# Patient Record
Sex: Male | Born: 1975 | Race: Black or African American | Hispanic: No | Marital: Single | Smoking: Former smoker
Health system: Southern US, Community
[De-identification: ages and names within clinical notes are randomized; demographics above are authoritative.]

## PROBLEM LIST (undated history)

## (undated) DIAGNOSIS — I509 Heart failure, unspecified: Secondary | ICD-10-CM

## (undated) DIAGNOSIS — I10 Essential (primary) hypertension: Secondary | ICD-10-CM

## (undated) DIAGNOSIS — E119 Type 2 diabetes mellitus without complications: Secondary | ICD-10-CM

## (undated) HISTORY — DX: Type 2 diabetes mellitus without complications: E11.9

## (undated) HISTORY — DX: Heart failure, unspecified: I50.9

## (undated) HISTORY — DX: Essential (primary) hypertension: I10

## (undated) HISTORY — PX: NO PAST SURGERIES: SHX2092

---

## 2019-03-12 DIAGNOSIS — E66813 Obesity, class 3: Secondary | ICD-10-CM

## 2019-03-12 HISTORY — DX: Morbid (severe) obesity due to excess calories: E66.01

## 2019-03-12 HISTORY — DX: Obesity, class 3: E66.813

## 2019-03-13 DIAGNOSIS — E119 Type 2 diabetes mellitus without complications: Secondary | ICD-10-CM

## 2019-03-13 DIAGNOSIS — I10 Essential (primary) hypertension: Secondary | ICD-10-CM | POA: Insufficient documentation

## 2019-03-13 DIAGNOSIS — R5381 Other malaise: Secondary | ICD-10-CM | POA: Insufficient documentation

## 2019-03-13 DIAGNOSIS — R778 Other specified abnormalities of plasma proteins: Secondary | ICD-10-CM

## 2019-03-13 DIAGNOSIS — R7989 Other specified abnormal findings of blood chemistry: Secondary | ICD-10-CM | POA: Insufficient documentation

## 2019-03-13 HISTORY — DX: Other specified abnormalities of plasma proteins: R77.8

## 2019-03-13 HISTORY — DX: Type 2 diabetes mellitus without complications: E11.9

## 2019-03-13 HISTORY — DX: Other specified abnormal findings of blood chemistry: R79.89

## 2019-03-13 HISTORY — DX: Other malaise: R53.81

## 2019-04-02 DIAGNOSIS — I5032 Chronic diastolic (congestive) heart failure: Secondary | ICD-10-CM

## 2019-04-02 HISTORY — DX: Chronic diastolic (congestive) heart failure: I50.32

## 2019-04-23 DIAGNOSIS — R0683 Snoring: Secondary | ICD-10-CM | POA: Insufficient documentation

## 2019-04-23 DIAGNOSIS — J9621 Acute and chronic respiratory failure with hypoxia: Secondary | ICD-10-CM

## 2019-04-23 DIAGNOSIS — R0681 Apnea, not elsewhere classified: Secondary | ICD-10-CM

## 2019-04-23 HISTORY — DX: Apnea, not elsewhere classified: R06.81

## 2019-04-23 HISTORY — DX: Snoring: R06.83

## 2019-04-23 HISTORY — DX: Acute and chronic respiratory failure with hypoxia: J96.21

## 2019-07-11 ENCOUNTER — Ambulatory Visit (INDEPENDENT_AMBULATORY_CARE_PROVIDER_SITE_OTHER): Payer: 59 | Admitting: Medical

## 2019-07-11 ENCOUNTER — Encounter: Payer: Self-pay | Admitting: Medical

## 2019-07-11 ENCOUNTER — Other Ambulatory Visit: Payer: Self-pay

## 2019-07-11 VITALS — BP 160/110 | HR 103 | Temp 97.2°F | Resp 18 | Ht 67.0 in | Wt 324.4 lb

## 2019-07-11 DIAGNOSIS — I509 Heart failure, unspecified: Secondary | ICD-10-CM

## 2019-07-11 DIAGNOSIS — I10 Essential (primary) hypertension: Secondary | ICD-10-CM | POA: Diagnosis not present

## 2019-07-11 DIAGNOSIS — E119 Type 2 diabetes mellitus without complications: Secondary | ICD-10-CM | POA: Diagnosis not present

## 2019-07-11 MED ORDER — HYDRALAZINE HCL 25 MG PO TABS: 25.0000 mg | ORAL_TABLET | Freq: Three times a day (TID) | ORAL | 0 refills | Status: DC

## 2019-07-11 MED ORDER — METOPROLOL TARTRATE 25 MG PO TABS: ORAL_TABLET | ORAL | 0 refills | Status: DC

## 2019-07-11 MED ORDER — AMILORIDE HCL 5 MG PO TABS: 5.0000 mg | ORAL_TABLET | Freq: Every day | ORAL | 0 refills | Status: DC

## 2019-07-11 MED ORDER — TORSEMIDE 20 MG PO TABS: 20.0000 mg | ORAL_TABLET | Freq: Every day | ORAL | 0 refills | Status: DC

## 2019-07-11 NOTE — Patient Instructions (Signed)
For htn, chf and diabetes, I filled all your former meds. You have metformin so continue to use.  Need to get labs and cxr to evaluated current conditions.  Hopefully bp will start to come down.   If you have cardiac or neurologic signs or symptoms then be seen in ED.  Follow up in 10 days or as needed. New to our practice so follow up extremely important.

## 2019-07-11 NOTE — Progress Notes (Signed)
Subjective:    Patient ID: Karl Jones, male    DOB: 10/16/1975, 44 y.o.   MRN: [id number]  HPI Pt in for first time here.  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 Pt is going to [employer] 5 days a week. Non smoker. Rare alcohol. Single.  Pt has hx of htn, diabetes and by chart review chf.  Pt bp is elevated today. Pt was on torsemide, lopressor and  Hydralazine.  Also chf diagnosis. Chart indicates he was also on midamor.  Pt has diabetes and is on metformin.   Pt also has sleep apnea. He uses cpap. Pt states he feels like he can't breath when uses mask.      Review of Systems  Constitutional: Negative for chills, fatigue and fever.  Respiratory: Negative for cough, chest tightness, shortness of breath and wheezing.   Cardiovascular: Negative for chest pain and palpitations.  Gastrointestinal: Negative for abdominal pain.  Genitourinary: Negative for dysuria, flank pain and frequency.  Musculoskeletal: Negative for back pain.  Neurological: Negative for dizziness, seizures, syncope, weakness, light-headedness and headaches.  Hematological: Negative for adenopathy. Does not bruise/bleed easily.  Psychiatric/Behavioral: Negative for behavioral problems and confusion.    Past Medical History:  Diagnosis Date  . CHF (congestive heart failure) (HCC)   . Diabetes mellitus without complication (HCC)   . Hypertension      Social History   Socioeconomic History  . Marital status: Single    Spouse name: Not on file  . Number of children: Not on file  . Years of education: Not on file  . Highest education level: Not on file  Occupational History  . Not on file  Tobacco Use  . Smoking status: Former Games developer  . Smokeless tobacco: Never Used  . Tobacco comment: last 3-4 years smoked about 3 cigrettes.  Substance and Sexual Activity  . Alcohol use: Yes    Comment: very rare.  . Drug use: Yes    Types: Marijuana     Comment: occasional/rare.  . Sexual activity: Not on file  Other Topics Concern  . Not on file  Social History Narrative  . Not on file   Social Determinants of Health   Financial Resource Strain:   . Difficulty of Paying Living Expenses:   Food Insecurity:   . Worried About Programme researcher, broadcasting/film/video in the Last Year:   . Barista in the Last Year:   Transportation Needs:   . Freight forwarder (Medical):   Marland Kitchen Lack of Transportation (Non-Medical):   Physical Activity:   . Days of Exercise per Week:   . Minutes of Exercise per Session:   Stress:   . Feeling of Stress :   Social Connections:   . Frequency of Communication with Friends and Family:   . Frequency of Social Gatherings with Friends and Family:   . Attends Religious Services:   . Active Member of Clubs or Organizations:   . Attends Banker Meetings:   Marland Kitchen Marital Status:   Intimate Partner Violence:   . Fear of Current or Ex-Partner:   . Emotionally Abused:   Marland Kitchen Physically Abused:   . Sexually Abused:     History reviewed. No pertinent surgical history.  History reviewed. No pertinent family history.  No Known Allergies  Current Outpatient Medications on File Prior to Visit  Medication Sig Dispense Refill  . metFORMIN (GLUCOPHAGE-XR) 500 MG 24 hr tablet Take by mouth.  No current facility-administered medications on file prior to visit.    BP (!) 160/110 (BP Location: Left Arm, Patient Position: Sitting, Cuff Size: Large)   Pulse (!) 103   Temp (!) 97.2 F (36.2 C) (Temporal)   Resp 18   Ht 5\' 7"  (1.702 m)   Wt (!) 324 lb 6.4 oz (147.1 kg)   SpO2 94%   BMI 50.81 kg/m       Objective:   Physical Exam   General Mental Status- Alert. General Appearance- Not in acute distress.   Skin General: Color- Normal Color. Moisture- Normal Moisture.  Neck Carotid Arteries- Normal color. Moisture- Normal Moisture. No carotid bruits. No JVD.  Chest and Lung  Exam Auscultation: Breath Sounds:-Normal.  Cardiovascular Auscultation:Rythm- Regular. Murmurs & Other Heart Sounds:Auscultation of the heart reveals- No Murmurs.  Abdomen Inspection:-Inspeection Normal. Palpation/Percussion:Note:No mass. Palpation and Percussion of the abdomen reveal- Non Tender, Non Distended + BS, no rebound or guarding.    Neurologic Cranial Nerve exam:- CN III-XII intact(No nystagmus), symmetric smile. Strength:- 5/5 equal and symmetric strength both upper and lower extremities.   Lower ext- no pedal edema. Negative homans bilaterally.    Assessment & Plan:  For htn, chf and diabetes, I filled all your former meds. You have metformin so continue to use.  Need to get labs and cxr to evaluated current conditions.  Hopefully bp will start to come down.   If you have cardiac or neurologic signs or symptoms then be seen in ED.  Follow up in 2 weeks or as needed. New to our practice so follow up extremely important.  , PA-C

## 2019-07-12 ENCOUNTER — Other Ambulatory Visit: Payer: Self-pay | Admitting: Medical

## 2019-07-12 ENCOUNTER — Telehealth: Payer: Self-pay | Admitting: Medical

## 2019-07-12 LAB — COMPREHENSIVE METABOLIC PANEL
ALT: 21 U/L (ref 0–53)
AST: 22 U/L (ref 0–37)
Albumin: 4.4 g/dL (ref 3.5–5.2)
Alkaline Phosphatase: 83 U/L (ref 39–117)
BUN: 11 mg/dL (ref 6–23)
CO2: 36 mEq/L — ABNORMAL HIGH (ref 19–32)
Calcium: 9.9 mg/dL (ref 8.4–10.5)
Chloride: 98 mEq/L (ref 96–112)
Creatinine, Ser: 0.99 mg/dL (ref 0.40–1.50)
GFR: 99.97 mL/min (ref >60.00)
Glucose, Bld: 98 mg/dL (ref 70–99)
Potassium: 3.2 mEq/L — ABNORMAL LOW (ref 3.5–5.1)
Sodium: 141 mEq/L (ref 135–145)
Total Bilirubin: 0.3 mg/dL (ref 0.2–1.2)
Total Protein: 7.9 g/dL (ref 6.0–8.3)

## 2019-07-12 LAB — HEMOGLOBIN A1C: Hgb A1c MFr Bld: 5.2 % (ref 4.6–6.5)

## 2019-07-12 LAB — BRAIN NATRIURETIC PEPTIDE: Pro B Natriuretic peptide (BNP): 223 pg/mL — ABNORMAL HIGH (ref 0.0–100.0)

## 2019-07-12 MED ORDER — POTASSIUM CHLORIDE ER 10 MEQ PO TBCR: 10.0000 meq | EXTENDED_RELEASE_TABLET | Freq: Every day | ORAL | 0 refills | Status: DC

## 2019-07-12 NOTE — Telephone Encounter (Signed)
Low dose potassium sent in for low potassium

## 2019-07-13 ENCOUNTER — Telehealth: Payer: Self-pay | Admitting: Medical

## 2019-07-13 NOTE — Telephone Encounter (Signed)
Pt k is low. He is on torsemide and mdiamor. New pt to our practice. Brief rx k as he restarts all his meds.

## 2019-07-23 ENCOUNTER — Ambulatory Visit: Payer: 59 | Admitting: Medical

## 2019-07-23 ENCOUNTER — Other Ambulatory Visit: Payer: Self-pay

## 2019-07-23 ENCOUNTER — Encounter: Payer: Self-pay | Admitting: Medical

## 2019-07-23 VITALS — BP 130/70 | HR 93 | Resp 18 | Ht 67.0 in | Wt 329.4 lb

## 2019-07-23 DIAGNOSIS — I10 Essential (primary) hypertension: Secondary | ICD-10-CM | POA: Diagnosis not present

## 2019-07-23 DIAGNOSIS — I509 Heart failure, unspecified: Secondary | ICD-10-CM | POA: Diagnosis not present

## 2019-07-23 DIAGNOSIS — E119 Type 2 diabetes mellitus without complications: Secondary | ICD-10-CM

## 2019-07-23 DIAGNOSIS — E876 Hypokalemia: Secondary | ICD-10-CM | POA: Diagnosis not present

## 2019-07-23 NOTE — Progress Notes (Signed)
Subjective:    Patient ID: Karl Jones, male    DOB: 1975-07-06, 44 y.o.   MRN: [id number]  HPI  Pt in today for follow up.   Hx of htn, chf and diabetes. Pt had been off all of his meds for 2 weeks before I saw him.  I refilled all of his meds and he is in for follow up on low potassium and to recheck his bp.  Pt started back on amiloride, hydralazine, metoprolol and torsemide.   Pt tells me in past he was taking hydralazine 25 mg 2 tab po tid.        Review of Systems  Constitutional: Negative for chills and fatigue.  Respiratory: Negative for cough, chest tightness, shortness of breath and wheezing.   Cardiovascular: Negative for chest pain and palpitations.  Musculoskeletal: Negative for back pain, myalgias and neck stiffness.  Skin: Negative for rash.  Neurological: Negative for dizziness, seizures, weakness, numbness and headaches.  Hematological: Negative for adenopathy. Does not bruise/bleed easily.  Psychiatric/Behavioral: Negative for confusion, dysphoric mood, sleep disturbance and suicidal ideas. The patient is not nervous/anxious.     Past Medical History:  Diagnosis Date  . CHF (congestive heart failure) (HCC)   . Diabetes mellitus without complication (HCC)   . Hypertension      Social History   Socioeconomic History  . Marital status: Single    Spouse name: Not on file  . Number of children: Not on file  . Years of education: Not on file  . Highest education level: Not on file  Occupational History  . Not on file  Tobacco Use  . Smoking status: Former Games developer  . Smokeless tobacco: Never Used  . Tobacco comment: last 3-4 years smoked about 3 cigrettes.  Substance and Sexual Activity  . Alcohol use: Yes    Comment: very rare.  . Drug use: Yes    Types: Marijuana    Comment: occasional/rare.  . Sexual activity: Not on file  Other Topics Concern  . Not on file  Social History Narrative  . Not on file   Social Determinants of Health     Financial Resource Strain:   . Difficulty of Paying Living Expenses:   Food Insecurity:   . Worried About Programme researcher, broadcasting/film/video in the Last Year:   . Barista in the Last Year:   Transportation Needs:   . Freight forwarder (Medical):   Marland Kitchen Lack of Transportation (Non-Medical):   Physical Activity:   . Days of Exercise per Week:   . Minutes of Exercise per Session:   Stress:   . Feeling of Stress :   Social Connections:   . Frequency of Communication with Friends and Family:   . Frequency of Social Gatherings with Friends and Family:   . Attends Religious Services:   . Active Member of Clubs or Organizations:   . Attends Banker Meetings:   Marland Kitchen Marital Status:   Intimate Partner Violence:   . Fear of Current or Ex-Partner:   . Emotionally Abused:   Marland Kitchen Physically Abused:   . Sexually Abused:     No past surgical history on file.  No family history on file.  No Known Allergies  Current Outpatient Medications on File Prior to Visit  Medication Sig Dispense Refill  . aMILoride (MIDAMOR) 5 MG tablet Take 1 tablet (5 mg total) by mouth daily. 30 tablet 0  . hydrALAZINE (APRESOLINE) 25 MG tablet Take 1 tablet (  25 mg total) by mouth 3 (three) times daily. 90 tablet 0  . metFORMIN (GLUCOPHAGE-XR) 500 MG 24 hr tablet Take by mouth.    . metoprolol tartrate (LOPRESSOR) 25 MG tablet 1/2 tab po q day 45 tablet 0  . potassium chloride (KLOR-CON) 10 MEQ tablet TAKE 1 TABLET BY MOUTH ONCE DAILY 7 tablet 0  . torsemide (DEMADEX) 20 MG tablet Take 1 tablet (20 mg total) by mouth daily. 30 tablet 0   No current facility-administered medications on file prior to visit.    BP 130/70   Pulse 93   Resp 18   Ht 5\' 7"  (1.702 m)   Wt (!) 329 lb 6.4 oz (149.4 kg)   SpO2 94%   BMI 51.59 kg/m       Objective:   Physical Exam  General Mental Status- Alert. General Appearance- Not in acute distress.   Skin General: Color- Normal Color. Moisture- Normal  Moisture.  Neck Carotid Arteries- Normal color. Moisture- Normal Moisture. No carotid bruits. No JVD.  Chest and Lung Exam Auscultation: Breath Sounds:-Normal.  Cardiovascular Auscultation:Rythm- Regular. Murmurs & Other Heart Sounds:Auscultation of the heart reveals- No Murmurs.  Abdomen Inspection:-Inspeection Normal. Palpation/Percussion:Note:No mass. Palpation and Percussion of the abdomen reveal- Non Tender, Non Distended + BS, no rebound or guarding.   Neurologic Cranial Nerve exam:- CN III-XII intact(No nystagmus), symmetric smile. Strength:- 5/5 equal and symmetric strength both upper and lower extremities.      Assessment & Plan:  Your bp was about 130/70 3 consecutive manual  checks today after very high machine readings. Check bp at home as we want to see your daily trend. If an bp readings 150 systolic or over 90 diastolic let know. Migh adjust bp meds if you get elevated levels.  For chf hx will refer you to cardiologist.   For low k on labs repeat cmp today. Include lipid panel today as well to assess risk factors for cardiovascular diseases.  Your sugar has been tightly controlled.  Follow up in 1 month or as needed  Gave rx for electronic bp cuff.  Time spent with patient today was 30  minutes which consisted of chart review, discussing diagnosis, work up, lab review,  Treatment,placing referral and documentation.

## 2019-07-23 NOTE — Patient Instructions (Addendum)
Your bp was about 130/70 3 consecutive manual checks today after very high machine readings. Check bp at home as we want to see your daily trend. If an bp readings 150 systolic or over 90 diastolic let us know. Migh adjust bp meds if you get elevated levels.  For chf hx will refer you to cardiologist.   For low k on labs repeat cmp today. Include lipid panel today as well to assess risk factors for cardiovascular diseases.  Your sugar has been tightly controlled.  Follow up in 1 month or as needed

## 2019-07-24 ENCOUNTER — Telehealth: Payer: Self-pay | Admitting: Medical

## 2019-07-24 LAB — LIPID PANEL
Cholesterol: 218 mg/dL — ABNORMAL HIGH (ref 0–200)
HDL: 41.8 mg/dL (ref >39.00)
NonHDL: 175.81
Total CHOL/HDL Ratio: 5
Triglycerides: 218 mg/dL — ABNORMAL HIGH (ref 0.0–149.0)
VLDL: 43.6 mg/dL — ABNORMAL HIGH (ref 0.0–40.0)

## 2019-07-24 LAB — COMPREHENSIVE METABOLIC PANEL
ALT: 25 U/L (ref 0–53)
AST: 24 U/L (ref 0–37)
Albumin: 4.3 g/dL (ref 3.5–5.2)
Alkaline Phosphatase: 79 U/L (ref 39–117)
BUN: 13 mg/dL (ref 6–23)
CO2: 36 mEq/L — ABNORMAL HIGH (ref 19–32)
Calcium: 9.2 mg/dL (ref 8.4–10.5)
Chloride: 98 mEq/L (ref 96–112)
Creatinine, Ser: 0.99 mg/dL (ref 0.40–1.50)
GFR: 99.96 mL/min (ref >60.00)
Glucose, Bld: 92 mg/dL (ref 70–99)
Potassium: 3.8 mEq/L (ref 3.5–5.1)
Sodium: 141 mEq/L (ref 135–145)
Total Bilirubin: 0.3 mg/dL (ref 0.2–1.2)
Total Protein: 7.8 g/dL (ref 6.0–8.3)

## 2019-07-24 LAB — LDL CHOLESTEROL, DIRECT: Direct LDL: 135 mg/dL

## 2019-07-24 MED ORDER — ATORVASTATIN CALCIUM 10 MG PO TABS: 10.0000 mg | ORAL_TABLET | Freq: Every day | ORAL | 11 refills | Status: DC

## 2019-07-24 NOTE — Telephone Encounter (Signed)
Rx atorvastatin sent to pt pharmacy. 

## 2019-07-25 ENCOUNTER — Telehealth: Payer: Self-pay | Admitting: Medical

## 2019-07-25 NOTE — Telephone Encounter (Signed)
Patient called in regards to lab results. Patient would like a call back.

## 2019-07-25 NOTE — Telephone Encounter (Signed)
Labs were reviewed.

## 2019-08-17 DIAGNOSIS — I509 Heart failure, unspecified: Secondary | ICD-10-CM | POA: Insufficient documentation

## 2019-08-20 ENCOUNTER — Ambulatory Visit (INDEPENDENT_AMBULATORY_CARE_PROVIDER_SITE_OTHER): Payer: 59 | Admitting: Cardiology

## 2019-08-20 ENCOUNTER — Encounter: Payer: Self-pay | Admitting: Cardiology

## 2019-08-20 ENCOUNTER — Other Ambulatory Visit: Payer: Self-pay

## 2019-08-20 VITALS — BP 128/94 | HR 98 | Ht 67.0 in | Wt 304.0 lb

## 2019-08-20 DIAGNOSIS — E782 Mixed hyperlipidemia: Secondary | ICD-10-CM

## 2019-08-20 DIAGNOSIS — I10 Essential (primary) hypertension: Secondary | ICD-10-CM

## 2019-08-20 DIAGNOSIS — R0600 Dyspnea, unspecified: Secondary | ICD-10-CM | POA: Diagnosis not present

## 2019-08-20 DIAGNOSIS — R0609 Other forms of dyspnea: Secondary | ICD-10-CM

## 2019-08-20 DIAGNOSIS — G4730 Sleep apnea, unspecified: Secondary | ICD-10-CM

## 2019-08-20 HISTORY — DX: Dyspnea, unspecified: R06.00

## 2019-08-20 HISTORY — DX: Morbid (severe) obesity due to excess calories: E66.01

## 2019-08-20 HISTORY — DX: Other forms of dyspnea: R06.09

## 2019-08-20 HISTORY — DX: Mixed hyperlipidemia: E78.2

## 2019-08-20 HISTORY — DX: Essential (primary) hypertension: I10

## 2019-08-20 HISTORY — DX: Sleep apnea, unspecified: G47.30

## 2019-08-20 NOTE — Patient Instructions (Signed)
Medication Instructions:  No medication changes. *If you need a refill on your cardiac medications before your next appointment, please call your pharmacy*   Lab Work: Your physician recommends that you have a BMET and Magnesium level done in the office today.  If you have labs (blood work) drawn today and your tests are completely normal, you will receive your results only by: Marland Kitchen MyChart Message (if you have MyChart) OR . A paper copy in the mail If you have any lab test that is abnormal or we need to change your treatment, we will call you to review the results.   Testing/Procedures: Your physician has requested that you have an echocardiogram. Echocardiography is a painless test that uses sound waves to create images of your heart. It provides your doctor with information about the size and shape of your heart and how well your heart's chambers and valves are working. This procedure takes approximately one hour. There are no restrictions for this procedure.  Your physician has requested that you have a lexiscan myoview. For further information please visit [email or web address] Please follow instruction sheet, as given.  The test will over 2 days and take approximately 3 to 4 hours each day to complete; you may bring reading material.  If someone comes with you to your appointment, they will need to remain in the main lobby due to limited space in the testing area.   How to prepare for your Myocardial Perfusion Test: . Do not eat or drink 3 hours prior to your test, except you may have water. . Do not consume products containing caffeine (regular or decaffeinated) 12 hours prior to your test. (ex: coffee, chocolate, sodas, tea). . Do bring a list of your current medications with you.  If not listed below, you may take your medications as normal. . Do wear comfortable clothes (no dresses or overalls) and walking shoes, tennis shoes preferred (No heels or open toe shoes are allowed). . Do  NOT wear cologne, perfume, aftershave, or lotions (deodorant is allowed). . If these instructions are not followed, your test will have to be rescheduled.   Follow-Up: At [facility], you and your health needs are our priority.  As part of our continuing mission to provide you with exceptional heart care, we have created designated Provider Care Teams.  These Care T Cardiac Nuclear Scan A cardiac nuclear scan is a test that is done to check the flow of blood to your heart. It is done when you are resting and when you are exercising. The test looks for problems such as:  Not enough blood reaching a portion of the heart.  The heart muscle not working as it should. You may need this test if:  You have heart disease.  You have had lab results that are not normal.  You have had heart surgery or a balloon procedure to open up blocked arteries (angioplasty).  You have chest pain.  You have shortness of breath. In this test, a special dye (tracer) is put into your bloodstream. The tracer will travel to your heart. A camera will then take pictures of your heart to see how the tracer moves through your heart. This test is usually done at a hospital and takes 2-4 hours. Tell a doctor about:  Any allergies you have.  All medicines you are taking, including vitamins, herbs, eye drops, creams, and over-the-counter medicines.  Any problems you or family members have had with anesthetic medicines.  Any blood disorders you have.  Any surgeries you have had.  Any medical conditions you have.  Whether you are pregnant or may be pregnant. What are the risks? Generally, this is a safe test. However, problems may occur, such as:  Serious chest pain and heart attack. This is only a risk if the stress portion of the test is done.  Rapid heartbeat.  A feeling of warmth in your chest. This feeling usually does not last long.  Allergic reaction to the tracer. What happens before the  test?  Ask your doctor about changing or stopping your normal medicines. This is important.  Follow instructions from your doctor about what you cannot eat or drink.  Remove your jewelry on the day of the test. What happens during the test?  An IV tube will be inserted into one of your veins.  Your doctor will give you a small amount of tracer through the IV tube.  You will wait for 20-40 minutes while the tracer moves through your bloodstream.  Your heart will be monitored with an electrocardiogram (ECG).  You will lie down on an exam table.  Pictures of your heart will be taken for about 15-20 minutes.  You may also have a stress test. For this test, one of these things may be done: ? You will be asked to exercise on a treadmill or a stationary bike. ? You will be given medicines that will make your heart work harder. This is done if you are unable to exercise.  When blood flow to your heart has peaked, a tracer will again be given through the IV tube.  After 20-40 minutes, you will get back on the exam table. More pictures will be taken of your heart.  Depending on the tracer that is used, more pictures may need to be taken 3-4 hours later.  Your IV tube will be removed when the test is over. The test may vary among doctors and hospitals. What happens after the test?  Ask your doctor: ? Whether you can return to your normal schedule, including diet, activities, and medicines. ? Whether you should drink more fluids. This will help to remove the tracer from your body. Drink enough fluid to keep your pee (urine) pale yellow.  Ask your doctor, or the department that is doing the test: ? When will my results be ready? ? How will I get my results? Summary  A cardiac nuclear scan is a test that is done to check the flow of blood to your heart.  Tell your doctor whether you are pregnant or may be pregnant.  Before the test, ask your doctor about changing or stopping your  normal medicines. This is important.  Ask your doctor whether you can return to your normal activities. You may be asked to drink more fluids. This information is not intended to replace advice given to you by your health care provider. Make sure you discuss any questions you have with your health care provider. Document Revised: 04/19/2018 Document Reviewed: 06/13/2017 Elsevier Patient Education  2020 [employer]. eams include your primary Cardiologist (physician) and Advanced Practice Providers (APPs -  Physician Assistants and Nurse Practitioners) who all work together to provide you with the care you need, when you need it.  We recommend signing up for the patient portal called "MyChart".  Sign up information is provided on this After Visit Summary.  MyChart is used to connect with patients for Virtual Visits (Telemedicine).  Patients are able to view lab/test results, encounter notes, upcoming  appointments, etc.  Non-urgent messages can be sent to your provider as well.   To learn more about what you can do with MyChart, go to [email or web address].    Your next appointment:   3 month(s)  The format for your next appointment:   In Person  Provider:   Belva Crome, MD   Other Instructions  Echocardiogram An echocardiogram is a procedure that uses painless sound waves (ultrasound) to produce an image of the heart. Images from an echocardiogram can provide important information about:  Signs of coronary artery disease (CAD).  Aneurysm detection. An aneurysm is a weak or damaged part of an artery wall that bulges out from the normal force of blood pumping through the body.  Heart size and shape. Changes in the size or shape of the heart can be associated with certain conditions, including heart failure, aneurysm, and CAD.  Heart muscle function.  Heart valve function.  Signs of a past heart attack.  Fluid buildup around the heart.  Thickening of the heart muscle.  A  tumor or infectious growth around the heart valves. Tell a health care provider about:  Any allergies you have.  All medicines you are taking, including vitamins, herbs, eye drops, creams, and over-the-counter medicines.  Any blood disorders you have.  Any surgeries you have had.  Any medical conditions you have.  Whether you are pregnant or may be pregnant. What are the risks? Generally, this is a safe procedure. However, problems may occur, including:  Allergic reaction to dye (contrast) that may be used during the procedure. What happens before the procedure? No specific preparation is needed. You may eat and drink normally. What happens during the procedure?   An IV tube may be inserted into one of your veins.  You may receive contrast through this tube. A contrast is an injection that improves the quality of the pictures from your heart.  A gel will be applied to your chest.  A wand-like tool (transducer) will be moved over your chest. The gel will help to transmit the sound waves from the transducer.  The sound waves will harmlessly bounce off of your heart to allow the heart images to be captured in real-time motion. The images will be recorded on a computer. The procedure may vary among health care providers and hospitals. What happens after the procedure?  You may return to your normal, everyday life, including diet, activities, and medicines, unless your health care provider tells you not to do that. Summary  An echocardiogram is a procedure that uses painless sound waves (ultrasound) to produce an image of the heart.  Images from an echocardiogram can provide important information about the size and shape of your heart, heart muscle function, heart valve function, and fluid buildup around your heart.  You do not need to do anything to prepare before this procedure. You may eat and drink normally.  After the echocardiogram is completed, you may return to your  normal, everyday life, unless your health care provider tells you not to do that. This information is not intended to replace advice given to you by your health care provider. Make sure you discuss any questions you have with your health care provider. Document Revised: 04/20/2018 Document Reviewed: 01/31/2016 Elsevier Patient Education  2020 [employer].

## 2019-08-20 NOTE — Progress Notes (Signed)
Cardiology Office Note:    Date:  08/20/2019   ID:  Karl Jones, DOB [birth year], MRN [id number]  PCP:  Esperanza Richters, PA-C  Cardiologist:  Garwin Brothers, MD   Referring MD: Esperanza Richters, PA-C    ASSESSMENT:    1. Hypertension, unspecified type   2. DOE (dyspnea on exertion)   3. Essential hypertension   4. Sleep apnea, unspecified type   5. Morbid obesity (HCC)   6. Mixed dyslipidemia    PLAN:    In order of problems listed above:  1. Primary prevention stressed with the patient.  Importance of compliance with diet medication stressed any vocalized understanding. 2. Essential hypertension: Blood pressure stable.  He will keep a track of it at home.  He is under stressful situation because of his father's death.  I told him to keep a track of his blood pressures. 3. Dyspnea on exertion: Knee pain anginal equivalents we will do a Lexiscan sestamibi to assess this.  He has multiple risk factors for coronary artery disease. 4. Diabetes mellitus and mixed dyslipidemia: Diet was emphasized.  He promises to do better. 5. Morbid obesity: Risks of obesity explained and he is going to do his best to lose weight.  He is already on the track to lose weight.  I explained to him the risks and he is very focused and is going to do this regularly. 6. Cardiac murmur: Echocardiogram will be done to assess murmur heard on auscultation. 7. Patient will be seen in follow-up appointment in 6 months or earlier if the patient has any concerns 8. He knows to go to nearest emergency room for any concerning symptoms.  Also for flu to prolongation I will do a Chem-7 and a magnesium level today.   Medication Adjustments/Labs and Tests Ordered: Current medicines are reviewed at length with the patient today.  Concerns regarding medicines are outlined above.  Orders Placed This Encounter  Procedures  . Basic metabolic panel  . Magnesium  . MYOCARDIAL PERFUSION IMAGING  . EKG 12-Lead  .  ECHOCARDIOGRAM COMPLETE   No orders of the defined types were placed in this encounter.    History of Present Illness:    Karl Jones is a 44 y.o. male who is being seen today for the evaluation of dyspnea on exertion and essential hypertension at the request of [place], Ramon Dred[place], [place].  Patient is a pleasant 44 year old male.  He has past medical history of essential hypertension dyslipidemia and diabetes mellitus.  He is morbidly obese.  He leads a sedentary lifestyle.  He has sleep apnea.  Unfortunately he lost his dad this morning and is sad for the same reason.  He denies any chest pain orthopnea or PND.  He mentions to me that his sleep apnea machine is effective and he is trying to get established with a sleep specialist who accepts his insurance.  At the time of my evaluation, the patient is alert awake oriented and in no distress.  Past Medical History:  Diagnosis Date  . Acute on chronic respiratory failure with hypoxia and hypercapnia (HCC) 04/23/2019  . CHF (congestive heart failure) (HCC)   . Chronic diastolic heart failure (HCC) 04/02/2019  . Class 3 severe obesity in adult [facility]) 03/12/2019  . Diabetes mellitus (HCC) 03/13/2019   Last Assessment & Plan:  Formatting of this note is different from the original. Lab Results  Component Value Date   HBA1C 6.6 (H) 03/13/2019  - Continue SSI - Hypoglycemia protocol -  TID AC fingersticks - Consistent carb diet  - Nutrition services consulted for diet instructions - Patient will require diabetic medication upon discharge  . Diabetes mellitus without complication (HCC)   . Elevated troponin I level 03/13/2019  . Hypertension   . Physical deconditioning 03/13/2019   Last Assessment & Plan:  Formatting of this note might be different from the original. Patient had a recent fall at home - PT/OT consulted.  May likely require facility placement.  . Snoring 04/23/2019  . Witnessed apneic spells 04/23/2019    Past Surgical History:  Procedure  Laterality Date  . NO PAST SURGERIES      Current Medications: Current Meds  Medication Sig  . aMILoride (MIDAMOR) 5 MG tablet Take 1 tablet (5 mg total) by mouth daily.  Marland Kitchen atorvastatin (LIPITOR) 10 MG tablet Take 1 tablet (10 mg total) by mouth daily.  . hydrALAZINE (APRESOLINE) 25 MG tablet Take 1 tablet (25 mg total) by mouth 3 (three) times daily.  . metFORMIN (GLUCOPHAGE-XR) 500 MG 24 hr tablet Take by mouth.  . metoprolol tartrate (LOPRESSOR) 25 MG tablet 1/2 tab po q day (Patient taking differently: Take 25 mg by mouth daily. 1/2 tab po q day)  . potassium chloride (KLOR-CON) 10 MEQ tablet TAKE 1 TABLET BY MOUTH ONCE DAILY  . torsemide (DEMADEX) 20 MG tablet Take 1 tablet (20 mg total) by mouth daily.     Allergies:   Patient has no known allergies.   Social History   Socioeconomic History  . Marital status: Single    Spouse name: Not on file  . Number of children: Not on file  . Years of education: Not on file  . Highest education level: Not on file  Occupational History  . Not on file  Tobacco Use  . Smoking status: Former Games developer  . Smokeless tobacco: Never Used  . Tobacco comment: last 3-4 years smoked about 3 cigrettes.  Substance and Sexual Activity  . Alcohol use: Yes    Comment: very rare.  . Drug use: Yes    Types: Marijuana    Comment: occasional/rare.  . Sexual activity: Not on file  Other Topics Concern  . Not on file  Social History Narrative  . Not on file   Social Determinants of Health   Financial Resource Strain:   . Difficulty of Paying Living Expenses:   Food Insecurity:   . Worried About Programme researcher, broadcasting/film/video in the Last Year:   . Barista in the Last Year:   Transportation Needs:   . Freight forwarder (Medical):   Marland Kitchen Lack of Transportation (Non-Medical):   Physical Activity:   . Days of Exercise per Week:   . Minutes of Exercise per Session:   Stress:   . Feeling of Stress :   Social Connections:   . Frequency of  Communication with Friends and Family:   . Frequency of Social Gatherings with Friends and Family:   . Attends Religious Services:   . Active Member of Clubs or Organizations:   . Attends Banker Meetings:   Marland Kitchen Marital Status:      Family History: The patient's family history includes Dementia in his maternal grandmother; Hypertension in his mother.  ROS:   Please see the history of present illness.    All other systems reviewed and are negative.  EKGs/Labs/Other Studies Reviewed:    The following studies were reviewed today: EKG with sinus rhythm and nonspecific ST-T changes and elevated  QT interval.   Recent Labs: 07/11/2019: Pro B Natriuretic peptide (BNP) 223.0 07/23/2019: ALT 25; BUN 13; Creatinine, Ser 0.99; Potassium 3.8; Sodium 141  Recent Lipid Panel    Component Value Date/Time   CHOL 218 (H) 07/23/2019 1544   TRIG 218.0 (H) 07/23/2019 1544   HDL 41.80 07/23/2019 1544   CHOLHDL 5 07/23/2019 1544   VLDL 43.6 (H) 07/23/2019 1544   LDLDIRECT 135.0 07/23/2019 1544    Physical Exam:    VS:  BP (!) 128/94 (BP Location: Left Arm)   Pulse 98   Ht 5\' 7"  (1.702 m)   Wt (!) 304 lb (137.9 kg)   SpO2 94%   BMI 47.61 kg/m     Wt Readings from Last 3 Encounters:  08/20/19 (!) 304 lb (137.9 kg)  07/23/19 (!) 329 lb 6.4 oz (149.4 kg)  07/11/19 (!) 324 lb 6.4 oz (147.1 kg)     GEN: Patient is in no acute distress HEENT: Normal NECK: No JVD; No carotid bruits LYMPHATICS: No lymphadenopathy CARDIAC: S1 S2 regular, 2/6 systolic murmur at the apex. RESPIRATORY:  Clear to auscultation without rales, wheezing or rhonchi  ABDOMEN: Soft, non-tender, non-distended MUSCULOSKELETAL:  No edema; No deformity  SKIN: Warm and dry NEUROLOGIC:  Alert and oriented x 3 PSYCHIATRIC:  Normal affect    Signed, 07/13/19, MD  08/20/2019 4:37 PM    [facility]

## 2019-08-21 LAB — BASIC METABOLIC PANEL
BUN/Creatinine Ratio: 10 (ref 9–20)
BUN: 12 mg/dL (ref 6–24)
CO2: 30 mmol/L — ABNORMAL HIGH (ref 20–29)
Calcium: 9.5 mg/dL (ref 8.7–10.2)
Chloride: 97 mmol/L (ref 96–106)
Creatinine, Ser: 1.24 mg/dL (ref 0.76–1.27)
GFR calc Af Amer: 82 mL/min/{1.73_m2} (ref >59)
GFR calc non Af Amer: 71 mL/min/{1.73_m2} (ref >59)
Glucose: 104 mg/dL — ABNORMAL HIGH (ref 65–99)
Potassium: 4 mmol/L (ref 3.5–5.2)
Sodium: 141 mmol/L (ref 134–144)

## 2019-08-21 LAB — MAGNESIUM: Magnesium: 1.8 mg/dL (ref 1.6–2.3)

## 2019-09-06 ENCOUNTER — Telehealth (HOSPITAL_COMMUNITY): Payer: Self-pay | Admitting: *Deleted

## 2019-09-06 NOTE — Telephone Encounter (Signed)
Patient given detailed instructions per Myocardial Perfusion Study Information Sheet for the test on 09/10/19 at 8:15. Patient notified to arrive 15 minutes early and that it is imperative to arrive on time for appointment to keep from having the test rescheduled.  If you need to cancel or reschedule your appointment, please call the office within 24 hours of your appointment. . Patient verbalized understanding.Karl Jones

## 2019-09-10 ENCOUNTER — Telehealth: Payer: Self-pay | Admitting: Medical

## 2019-09-10 ENCOUNTER — Other Ambulatory Visit: Payer: Self-pay

## 2019-09-10 ENCOUNTER — Ambulatory Visit (HOSPITAL_COMMUNITY): Payer: 59 | Attending: Cardiology

## 2019-09-10 ENCOUNTER — Encounter (HOSPITAL_COMMUNITY): Payer: Self-pay

## 2019-09-10 DIAGNOSIS — I10 Essential (primary) hypertension: Secondary | ICD-10-CM | POA: Diagnosis present

## 2019-09-10 DIAGNOSIS — R0609 Other forms of dyspnea: Secondary | ICD-10-CM

## 2019-09-10 DIAGNOSIS — R0600 Dyspnea, unspecified: Secondary | ICD-10-CM | POA: Diagnosis present

## 2019-09-10 MED ORDER — REGADENOSON 0.4 MG/5ML IV SOLN
0.4000 mg | Freq: Once | INTRAVENOUS | Status: AC
  Administered 2019-09-10: 0.4 mg via INTRAVENOUS

## 2019-09-10 MED ORDER — AMILORIDE HCL 5 MG PO TABS: 5.0000 mg | ORAL_TABLET | Freq: Every day | ORAL | 0 refills | Status: DC

## 2019-09-10 MED ORDER — TORSEMIDE 20 MG PO TABS: 20.0000 mg | ORAL_TABLET | Freq: Every day | ORAL | 0 refills | Status: DC

## 2019-09-10 MED ORDER — METOPROLOL TARTRATE 25 MG PO TABS: ORAL_TABLET | ORAL | 0 refills | Status: DC

## 2019-09-10 MED ORDER — TECHNETIUM TC 99M TETROFOSMIN IV KIT
32.7000 | PACK | Freq: Once | INTRAVENOUS | Status: AC | PRN
  Administered 2019-09-10: 32.7 via INTRAVENOUS
  Filled 2019-09-10: qty 33

## 2019-09-10 NOTE — Telephone Encounter (Signed)
Medication: torsemide (DEMADEX) 20 MG tablet [[id number]   aMILoride (MIDAMOR) 5 MG tablet [[id number]  metoprolol tartrate (LOPRESSOR) 25 MG tablet [[id number]]   Has the patient contacted their pharmacy? No. (If no, request that the patient contact the pharmacy for the refill.) (If yes, when and what did the pharmacy advise?)  Preferred Pharmacy (with phone number or street name): [id number] - [place], [place] - [address]  [address], [place] [address]  Phone:  [phone] Fax:  [phone]  DEA #:  --  Agent: Please be advised that RX refills may take up to 3 business days. We ask that you follow-up with your pharmacy.

## 2019-09-10 NOTE — Telephone Encounter (Signed)
Rx sent 

## 2019-09-11 ENCOUNTER — Ambulatory Visit (HOSPITAL_COMMUNITY): Payer: 59

## 2019-09-11 ENCOUNTER — Ambulatory Visit (HOSPITAL_COMMUNITY): Payer: 59 | Attending: Cardiovascular Disease

## 2019-09-11 LAB — MYOCARDIAL PERFUSION IMAGING
LV dias vol: 263 mL (ref 62–150)
LV sys vol: 198 mL
Peak HR: 110 {beats}/min
Rest HR: 100 {beats}/min
SDS: 2
SRS: 0
SSS: 2
TID: 1.19

## 2019-09-11 MED ORDER — TECHNETIUM TC 99M TETROFOSMIN IV KIT
31.3000 | PACK | Freq: Once | INTRAVENOUS | Status: AC | PRN
  Administered 2019-09-11: 31.3 via INTRAVENOUS
  Filled 2019-09-11: qty 32

## 2019-09-19 ENCOUNTER — Other Ambulatory Visit: Payer: Self-pay

## 2019-09-19 ENCOUNTER — Ambulatory Visit (HOSPITAL_COMMUNITY): Payer: 59 | Attending: Cardiology

## 2019-09-19 DIAGNOSIS — I10 Essential (primary) hypertension: Secondary | ICD-10-CM | POA: Diagnosis present

## 2019-09-19 DIAGNOSIS — R0600 Dyspnea, unspecified: Secondary | ICD-10-CM | POA: Diagnosis present

## 2019-09-19 LAB — ECHOCARDIOGRAM COMPLETE
Area-P 1/2: 5.13 cm2
S' Lateral: 3.8 cm

## 2019-09-19 MED ORDER — PERFLUTREN LIPID MICROSPHERE
1.0000 mL | INTRAVENOUS | Status: AC | PRN
  Administered 2019-09-19: 3 mL via INTRAVENOUS

## 2019-09-20 ENCOUNTER — Telehealth: Payer: Self-pay

## 2019-09-20 DIAGNOSIS — I420 Dilated cardiomyopathy: Secondary | ICD-10-CM

## 2019-09-20 DIAGNOSIS — I7789 Other specified disorders of arteries and arterioles: Secondary | ICD-10-CM

## 2019-09-20 DIAGNOSIS — I77810 Thoracic aortic ectasia: Secondary | ICD-10-CM

## 2019-09-20 NOTE — Telephone Encounter (Signed)
-----   Message from Garwin Brothers, MD sent at 09/20/2019  8:14 AM EDT ----- Overall echocardiogram is fine.  Ascending aorta mildly dilated.  Please order CT chest without contrast to evaluate this.  The results of the study is unremarkable. Please inform patient. I will discuss in detail at next appointment. Cc  primary care/referring physician Garwin Brothers, MD 09/20/2019 8:14 AM

## 2019-09-20 NOTE — Telephone Encounter (Signed)
Spoke with patient regarding results and recommendation.  Patient verbalizes understanding and is agreeable to plan of care. Advised patient to call back with any issues or concerns.  

## 2019-09-26 DIAGNOSIS — E119 Type 2 diabetes mellitus without complications: Secondary | ICD-10-CM | POA: Insufficient documentation

## 2019-09-27 ENCOUNTER — Encounter: Payer: Self-pay | Admitting: Cardiology

## 2019-09-27 ENCOUNTER — Other Ambulatory Visit: Payer: Self-pay

## 2019-09-27 ENCOUNTER — Ambulatory Visit (INDEPENDENT_AMBULATORY_CARE_PROVIDER_SITE_OTHER): Payer: 59 | Admitting: Cardiology

## 2019-09-27 VITALS — BP 145/89 | HR 76 | Ht 66.0 in | Wt 321.2 lb

## 2019-09-27 DIAGNOSIS — E119 Type 2 diabetes mellitus without complications: Secondary | ICD-10-CM | POA: Diagnosis not present

## 2019-09-27 DIAGNOSIS — R0683 Snoring: Secondary | ICD-10-CM

## 2019-09-27 DIAGNOSIS — E782 Mixed hyperlipidemia: Secondary | ICD-10-CM

## 2019-09-27 DIAGNOSIS — I209 Angina pectoris, unspecified: Secondary | ICD-10-CM | POA: Insufficient documentation

## 2019-09-27 DIAGNOSIS — I10 Essential (primary) hypertension: Secondary | ICD-10-CM

## 2019-09-27 DIAGNOSIS — I208 Other forms of angina pectoris: Secondary | ICD-10-CM

## 2019-09-27 DIAGNOSIS — I77810 Thoracic aortic ectasia: Secondary | ICD-10-CM | POA: Insufficient documentation

## 2019-09-27 HISTORY — DX: Angina pectoris, unspecified: I20.9

## 2019-09-27 HISTORY — DX: Thoracic aortic ectasia: I77.810

## 2019-09-27 MED ORDER — ASPIRIN EC 81 MG PO TBEC: 81.0000 mg | DELAYED_RELEASE_TABLET | Freq: Every day | ORAL | 3 refills | Status: DC

## 2019-09-27 MED ORDER — NITROGLYCERIN 0.4 MG SL SUBL: 0.4000 mg | SUBLINGUAL_TABLET | SUBLINGUAL | 6 refills | Status: DC | PRN

## 2019-09-27 NOTE — Patient Instructions (Signed)
Medication Instructions:  Your physician has recommended you make the following change in your medication:   Take Nitroglycerin as needed for chest pain. Take 81 mg coated Aspirin daily.  *If you need a refill on your cardiac medications before your next appointment, please call your pharmacy*   Lab Work: Your physician recommends that you return for lab work in: 1 week prior to your CT scan.  You can come Monday through Friday 8:30 am to 12:00 pm and 1:15 to 4:30. You do not need to make an appointment as the order has already been placed.   If you have labs (blood work) drawn today and your tests are completely normal, you will receive your results only by: Marland Kitchen MyChart Message (if you have MyChart) OR . A paper copy in the mail If you have any lab test that is abnormal or we need to change your treatment, we will call you to review the results.   Testing/Procedures: Your cardiac CT will be scheduled at:   [facility] [place], [place] [id number] [phone]   If scheduled at [facility], please arrive at the [facility] main entrance of [facility] 30 minutes prior to test start time. Proceed to the [facility] Radiology Department (first floor) to check-in and test prep.  Please follow these instructions carefully (unless otherwise directed):  Hold all erectile dysfunction medications at least 3 days (72 hrs) prior to test.  On the Night Before the Test: . Be sure to Drink plenty of water. . Do not consume any caffeinated/decaffeinated beverages or chocolate 12 hours prior to your test. . Do not take any antihistamines 12 hours prior to your test.  On the Day of the Test: . Drink plenty of water. Do not drink any water within one hour of the test. . Do not eat any food 4 hours prior to the test. . You may take your regular medications prior to the test.  . Take metoprolol (Lopressor) two hours prior to test. . HOLD Torsemide  the morning of the test.      After the Test: . Drink plenty of water. . After receiving IV contrast, you may experience a mild flushed feeling. This is normal. . On occasion, you may experience a mild rash up to 24 hours after the test. This is not dangerous. If this occurs, you can take Benadryl 25 mg and increase your fluid intake. . If you experience trouble breathing, this can be serious. If it is severe call 911 IMMEDIATELY. If it is mild, please call our office. . If you take any of these medications: Glipizide/Metformin, Avandament, Glucavance, please do not take 48 hours after completing test unless otherwise instructed.   Once we have confirmed authorization from your insurance company, we will call you to set up a date and time for your test. Based on how quickly your insurance processes prior authorizations requests, please allow up to 4 weeks to be contacted for scheduling your Cardiac CT appointment. Be advised that routine Cardiac CT appointments could be scheduled as many as 8 weeks after your provider has ordered it.  For non-scheduling related questions, please contact the cardiac imaging nurse navigator should you have any questions/concerns: Marchia Bond, Cardiac Imaging Nurse Navigator Burley Saver, Interim Cardiac Imaging Nurse [facility] Direct Office Dial: [phone]   For scheduling needs, including cancellations and rescheduling, please call Vivien Rota at [phone].     Follow-Up: At [facility]  HeartCare, you and your health needs are our priority.  As part of our continuing mission to provide you with exceptional heart care, we have created designated Provider Care Teams.  These Care Teams include your primary Cardiologist (physician) and Advanced Practice Providers (APPs -  Physician Assistants and Nurse Practitioners) who all work together to provide you with the care you need, when you need it.  We recommend signing up for the patient  portal called "MyChart".  Sign up information is provided on this After Visit Summary.  MyChart is used to connect with patients for Virtual Visits (Telemedicine).  Patients are able to view lab/test results, encounter notes, upcoming appointments, etc.  Non-urgent messages can be sent to your provider as well.   To learn more about what you can do with MyChart, go to [email or web address].    Your next appointment:   6 week(s)  The format for your next appointment:   In Person  Provider:   Jyl Heinz, MD   Other Instructions Cardiac CT Angiogram A cardiac CT angiogram is a procedure to look at the heart and the area around the heart. It may be done to help find the cause of chest pains or other symptoms of heart disease. During this procedure, a substance called contrast dye is injected into the blood vessels in the area to be checked. A large X-ray machine, called a CT scanner, then takes detailed pictures of the heart and the surrounding area. The procedure is also sometimes called a coronary CT angiogram, coronary artery scanning, or CTA. A cardiac CT angiogram allows the health care provider to see how well blood is flowing to and from the heart. The health care provider will be able to see if there are any problems, such as:  Blockage or narrowing of the coronary arteries in the heart.  Fluid around the heart.  Signs of weakness or disease in the muscles, valves, and tissues of the heart. Tell a health care provider about:  Any allergies you have. This is especially important if you have had a previous allergic reaction to contrast dye.  All medicines you are taking, including vitamins, herbs, eye drops, creams, and over-the-counter medicines.  Any blood disorders you have.  Any surgeries you have had.  Any medical conditions you have.  Whether you are pregnant or may be pregnant.  Any anxiety disorders, chronic pain, or other conditions you have that may increase  your stress or prevent you from lying still. What are the risks? Generally, this is a safe procedure. However, problems may occur, including: 1. Bleeding. 2. Infection. 3. Allergic reactions to medicines or dyes. 4. Damage to other structures or organs. 5. Kidney damage from the contrast dye that is used. 6. Increased risk of cancer from radiation exposure. This risk is low. Talk with your health care provider about: ? The risks and benefits of testing. ? How you can receive the lowest dose of radiation. What happens before the procedure? 1. Wear comfortable clothing and remove any jewelry, glasses, dentures, and hearing aids. 2. Follow instructions from your health care provider about eating and drinking. This may include: ? For 12 hours before the procedure -- avoid caffeine. This includes tea, coffee, soda, energy drinks, and diet pills. Drink plenty of water or other fluids that do not have caffeine in them. Being well hydrated can prevent complications. ? For 4-6 hours before the procedure -- stop eating and drinking. The contrast dye can cause nausea, but this is less likely  if your stomach is empty. 3. Ask your health care provider about changing or stopping your regular medicines. This is especially important if you are taking diabetes medicines, blood thinners, or medicines to treat problems with erections (erectile dysfunction). What happens during the procedure?  1. Hair on your chest may need to be removed so that small sticky patches called electrodes can be placed on your chest. These will transmit information that helps to monitor your heart during the procedure. 2. An IV will be inserted into one of your veins. 3. You might be given a medicine to control your heart rate during the procedure. This will help to ensure that good images are obtained. 4. You will be asked to lie on an exam table. This table will slide in and out of the CT machine during the procedure. 5. Contrast  dye will be injected into the IV. You might feel warm, or you may get a metallic taste in your mouth. 6. You will be given a medicine called nitroglycerin. This will relax or dilate the arteries in your heart. 7. The table that you are lying on will move into the CT machine tunnel for the scan. 8. The person running the machine will give you instructions while the scans are being done. You may be asked to: ? Keep your arms above your head. ? Hold your breath. ? Stay very still, even if the table is moving. 9. When the scanning is complete, you will be moved out of the machine. 10. The IV will be removed. The procedure may vary among health care providers and hospitals. What can I expect after the procedure? After your procedure, it is common to have:  A metallic taste in your mouth from the contrast dye.  A feeling of warmth.  A headache from the nitroglycerin. Follow these instructions at home:  Take over-the-counter and prescription medicines only as told by your health care provider.  If you are told, drink enough fluid to keep your urine pale yellow. This will help to flush the contrast dye out of your body.  Most people can return to their normal activities right after the procedure. Ask your health care provider what activities are safe for you.  It is up to you to get the results of your procedure. Ask your health care provider, or the department that is doing the procedure, when your results will be ready.  Keep all follow-up visits as told by your health care provider. This is important. Contact a health care provider if: 1. You have any symptoms of allergy to the contrast dye. These include: ? Shortness of breath. ? Rash or hives. ? A racing heartbeat. Summary  A cardiac CT angiogram is a procedure to look at the heart and the area around the heart. It may be done to help find the cause of chest pains or other symptoms of heart disease.  During this procedure, a large  X-ray machine, called a CT scanner, takes detailed pictures of the heart and the surrounding area after a contrast dye has been injected into blood vessels in the area.  Ask your health care provider about changing or stopping your regular medicines before the procedure. This is especially important if you are taking diabetes medicines, blood thinners, or medicines to treat erectile dysfunction.  If you are told, drink enough fluid to keep your urine pale yellow. This will help to flush the contrast dye out of your body. This information is not intended to replace  advice given to you by your health care provider. Make sure you discuss any questions you have with your health care provider. Document Revised: 08/23/2018 Document Reviewed: 08/23/2018 Elsevier Patient Education  [place].  Nitroglycerin sublingual tablets What is this medicine? NITROGLYCERIN (nye troe GLI ser in) is a type of vasodilator. It relaxes blood vessels, increasing the blood and oxygen supply to your heart. This medicine is used to relieve chest pain caused by angina. It is also used to prevent chest pain before activities like climbing stairs, going outdoors in cold weather, or sexual activity. This medicine may be used for other purposes; ask your health care provider or pharmacist if you have questions. COMMON BRAND NAME(S): Nitroquick, Nitrostat, Nitrotab What should I tell my health care provider before I take this medicine? They need to know if you have any of these conditions:  anemia  head injury, recent stroke, or bleeding in the brain  liver disease  previous heart attack  an unusual or allergic reaction to nitroglycerin, other medicines, foods, dyes, or preservatives  pregnant or trying to get pregnant  breast-feeding How should I use this medicine? Take this medicine by mouth as needed. At the first sign of an angina attack (chest pain or tightness) place one tablet under your tongue. You can  also take this medicine 5 to 10 minutes before an event likely to produce chest pain. Follow the directions on the prescription label. Let the tablet dissolve under the tongue. Do not swallow whole. Replace the dose if you accidentally swallow it. It will help if your mouth is not dry. Saliva around the tablet will help it to dissolve more quickly. Do not eat or drink, smoke or chew tobacco while a tablet is dissolving. If you are not better within 5 minutes after taking ONE dose of nitroglycerin, call 9-1-1 immediately to seek emergency medical care. Do not take more than 3 nitroglycerin tablets over 15 minutes. If you take this medicine often to relieve symptoms of angina, your doctor or health care professional may provide you with different instructions to manage your symptoms. If symptoms do not go away after following these instructions, it is important to call 9-1-1 immediately. Do not take more than 3 nitroglycerin tablets over 15 minutes. Talk to your pediatrician regarding the use of this medicine in children. Special care may be needed. Overdosage: If you think you have taken too much of this medicine contact a poison control center or emergency room at once. NOTE: This medicine is only for you. Do not share this medicine with others. What if I miss a dose? This does not apply. This medicine is only used as needed. What may interact with this medicine? Do not take this medicine with any of the following medications:  certain migraine medicines like ergotamine and dihydroergotamine (DHE)  medicines used to treat erectile dysfunction like sildenafil, tadalafil, and vardenafil  riociguat This medicine may also interact with the following medications:  alteplase  aspirin  heparin  medicines for high blood pressure  medicines for mental depression  other medicines used to treat angina  phenothiazines like chlorpromazine, mesoridazine, prochlorperazine, thioridazine This list may  not describe all possible interactions. Give your health care provider a list of all the medicines, herbs, non-prescription drugs, or dietary supplements you use. Also tell them if you smoke, drink alcohol, or use illegal drugs. Some items may interact with your medicine. What should I watch for while using this medicine? Tell your doctor or health care  professional if you feel your medicine is no longer working. Keep this medicine with you at all times. Sit or lie down when you take your medicine to prevent falling if you feel dizzy or faint after using it. Try to remain calm. This will help you to feel better faster. If you feel dizzy, take several deep breaths and lie down with your feet propped up, or bend forward with your head resting between your knees. You may get drowsy or dizzy. Do not drive, use machinery, or do anything that needs mental alertness until you know how this drug affects you. Do not stand or sit up quickly, especially if you are an older patient. This reduces the risk of dizzy or fainting spells. Alcohol can make you more drowsy and dizzy. Avoid alcoholic drinks. Do not treat yourself for coughs, colds, or pain while you are taking this medicine without asking your doctor or health care professional for advice. Some ingredients may increase your blood pressure. What side effects may I notice from receiving this medicine? Side effects that you should report to your doctor or health care professional as soon as possible:  blurred vision  dry mouth  skin rash  sweating  the feeling of extreme pressure in the head  unusually weak or tired Side effects that usually do not require medical attention (report to your doctor or health care professional if they continue or are bothersome):  flushing of the face or neck  headache  irregular heartbeat, palpitations  nausea, vomiting This list may not describe all possible side effects. Call your doctor for medical advice  about side effects. You may report side effects to FDA at 1-800-FDA-1088. Where should I keep my medicine? Keep out of the reach of children. Store at room temperature between 20 and 25 degrees C (68 and 77 degrees F). Store in [facility]. Protect from light and moisture. Keep tightly closed. Throw away any unused medicine after the expiration date. NOTE: This sheet is a summary. It may not cover all possible information. If you have questions about this medicine, talk to your doctor, pharmacist, or health care provider.  2020 Elsevier/Gold Standard (2012-10-26 17:57:36)

## 2019-09-27 NOTE — Progress Notes (Signed)
Cardiology Office Note:    Date:  09/27/2019   ID:  Karl Jones, DOB Aug 07, 1975, MRN [id number]  PCP:  Esperanza Richters, PA-C  Cardiologist:  Garwin Brothers, MD   Referring MD: Esperanza Richters, PA-C    ASSESSMENT:    1. Essential hypertension   2. Diabetes mellitus without complication (HCC)   3. Mixed dyslipidemia   4. Morbid obesity (HCC)   5. Snoring   6. Angina pectoris (HCC)   7. Ascending aorta dilatation (HCC)    PLAN:    In order of problems listed above:  1. Angina pectoris: Patient has multiple risk factors for coronary artery disease.  He has dyspnea on exertion.  He has some chest discomfort.  Concerned about him and in view of the stress test findings I would like to do a coronary CT angiography with FFR and he is agreeable.  He also wants to embark on exercise program which is strenuous.  I want to make sure that before he does so that I am comfortable letting him go for such a program.  The CT scan will also help me assess his ascending aorta which appeared dilated on the echocardiogram.  I discussed this with the patient at extensive length and questions were answered to satisfaction. 2. Essential hypertension: Blood pressure is stable.  Diet was emphasized.  He tells me that blood pressures are better at home.  Salt intake issues were discussed 3. Mixed dyslipidemia: On statin therapy. 4. Diabetes mellitus and morbid obesity: Diet was emphasized.  He has gained weight in the past several weeks.  I emphasized to him the risks of these and he understands and plans to do better. 5. He was advised to take a coated aspirin on a daily basis 81 mg.  Sublingual nitroglycerin prescription was sent, its protocol and 911 protocol explained and the patient vocalized understanding questions were answered to the patient's satisfaction 6. Patient will be seen in follow-up appointment in 6 weeks or earlier if the patient has any concerns    Medication Adjustments/Labs and  Tests Ordered: Current medicines are reviewed at length with the patient today.  Concerns regarding medicines are outlined above.  No orders of the defined types were placed in this encounter.  No orders of the defined types were placed in this encounter.    No chief complaint on file.    History of Present Illness:    Karl Jones is a 44 y.o. male.  Patient has past medical history of essential hypertension dyslipidemia diabetes mellitus and morbid obesity.  He mentions to me that when he exerts he has some shortness of breath.  Occasionally he may have chest tightness.  No orthopnea or PND.  At the time of my evaluation, the patient is alert awake oriented and in no distress.  His stress test revealed ejection fraction was reduced echocardiogram was unremarkable but revealed dilated ascending aorta.  At the time of my evaluation, the patient is alert awake oriented and in no distress.  Past Medical History:  Diagnosis Date  . Acute on chronic respiratory failure with hypoxia and hypercapnia (HCC) 04/23/2019  . CHF (congestive heart failure) (HCC)   . Chronic diastolic heart failure (HCC) 04/02/2019  . Class 3 severe obesity in adult [facility]) 03/12/2019  . Diabetes mellitus (HCC) 03/13/2019   Last Assessment & Plan:  Formatting of this note is different from the original. Lab Results  Component Value Date   HBA1C 6.6 (H) 03/13/2019  - Continue SSI -  Hypoglycemia protocol - TID AC fingersticks - Consistent carb diet  - Nutrition services consulted for diet instructions - Patient will require diabetic medication upon discharge  . Diabetes mellitus without complication (HCC)   . DOE (dyspnea on exertion) 08/20/2019  . Elevated troponin I level 03/13/2019  . Essential hypertension 08/20/2019  . Hypertension   . Morbid obesity (HCC) 08/20/2019  . Physical deconditioning 03/13/2019   Last Assessment & Plan:  Formatting of this note might be different from the original. Patient had a recent fall at home -  PT/OT consulted.  May likely require facility placement.  . Sleep apnea 08/20/2019  . Snoring 04/23/2019  . Witnessed apneic spells 04/23/2019    Past Surgical History:  Procedure Laterality Date  . NO PAST SURGERIES      Current Medications: Current Meds  Medication Sig  . aMILoride (MIDAMOR) 5 MG tablet Take 1 tablet (5 mg total) by mouth daily.  Marland Kitchen atorvastatin (LIPITOR) 10 MG tablet Take 1 tablet (10 mg total) by mouth daily.  . hydrALAZINE (APRESOLINE) 25 MG tablet Take 1 tablet (25 mg total) by mouth 3 (three) times daily.  . metFORMIN (GLUCOPHAGE-XR) 500 MG 24 hr tablet Take 500 mg by mouth daily with breakfast.   . metoprolol tartrate (LOPRESSOR) 25 MG tablet 1/2 tab po q day  . potassium chloride (KLOR-CON) 10 MEQ tablet TAKE 1 TABLET BY MOUTH ONCE DAILY  . torsemide (DEMADEX) 20 MG tablet Take 1 tablet (20 mg total) by mouth daily.     Allergies:   Patient has no known allergies.   Social History   Socioeconomic History  . Marital status: Single    Spouse name: Not on file  . Number of children: Not on file  . Years of education: Not on file  . Highest education level: Not on file  Occupational History  . Not on file  Tobacco Use  . Smoking status: Former Games developer  . Smokeless tobacco: Never Used  . Tobacco comment: last 3-4 years smoked about 3 cigrettes.  Substance and Sexual Activity  . Alcohol use: Yes    Comment: very rare.  . Drug use: Yes    Types: Marijuana    Comment: occasional/rare.  . Sexual activity: Not on file  Other Topics Concern  . Not on file  Social History Narrative  . Not on file   Social Determinants of Health   Financial Resource Strain:   . Difficulty of Paying Living Expenses: Not on file  Food Insecurity:   . Worried About Programme researcher, broadcasting/film/video in the Last Year: Not on file  . Ran Out of Food in the Last Year: Not on file  Transportation Needs:   . Lack of Transportation (Medical): Not on file  . Lack of Transportation  (Non-Medical): Not on file  Physical Activity:   . Days of Exercise per Week: Not on file  . Minutes of Exercise per Session: Not on file  Stress:   . Feeling of Stress : Not on file  Social Connections:   . Frequency of Communication with Friends and Family: Not on file  . Frequency of Social Gatherings with Friends and Family: Not on file  . Attends Religious Services: Not on file  . Active Member of Clubs or Organizations: Not on file  . Attends Banker Meetings: Not on file  . Marital Status: Not on file     Family History: The patient's family history includes Dementia in his maternal grandmother; Hypertension in  his mother.  ROS:   Please see the history of present illness.    All other systems reviewed and are negative.  EKGs/Labs/Other Studies Reviewed:    The following studies were reviewed today: IMPRESSIONS    1. Left ventricular ejection fraction, by estimation, is 60 to 65%. The  left ventricle has normal function. The left ventricle has no regional  wall motion abnormalities. There is moderate left ventricular hypertrophy.  Left ventricular diastolic  parameters are consistent with Grade II diastolic dysfunction  (pseudonormalization).  2. Right ventricular systolic function is normal. The right ventricular  size is moderately enlarged.  3. Left atrial size was mildly dilated.  4. Right atrial size was moderately dilated.  5. The mitral valve is normal in structure. No evidence of mitral valve  regurgitation. No evidence of mitral stenosis.  6. The aortic valve is normal in structure. Aortic valve regurgitation is  not visualized. No aortic stenosis is present.  7. Aortic dilatation noted. There is mild dilatation at the level of the  sinuses of Valsalva, measuring 38 mm. There is mild dilatation of the  ascending aorta, measuring 40 mm.  8. The inferior vena cava is normal in size with greater than 50%  respiratory variability,  suggesting right atrial pressure of 3 mmHg.   Study Highlights    The left ventricular ejection fraction is severely decreased (<30%).  Nuclear stress EF: 25%.  There was no ST segment deviation noted during stress.  This is a high risk study.   Normal resting and stress perfusion. No ischemia or infarction EF 25% Severe LVE with diffuse hypokinesis High risk study due to EF    Recent Labs: 07/11/2019: Pro B Natriuretic peptide (BNP) 223.0 07/23/2019: ALT 25 08/20/2019: BUN 12; Creatinine, Ser 1.24; Magnesium 1.8; Potassium 4.0; Sodium 141  Recent Lipid Panel    Component Value Date/Time   CHOL 218 (H) 07/23/2019 1544   TRIG 218.0 (H) 07/23/2019 1544   HDL 41.80 07/23/2019 1544   CHOLHDL 5 07/23/2019 1544   VLDL 43.6 (H) 07/23/2019 1544   LDLDIRECT 135.0 07/23/2019 1544    Physical Exam:    VS:  BP (!) 145/89   Pulse 76   Ht 5\' 6"  (1.676 m)   Wt (!) 321 lb 3.2 oz (145.7 kg)   SpO2 98%   BMI 51.84 kg/m     Wt Readings from Last 3 Encounters:  09/27/19 (!) 321 lb 3.2 oz (145.7 kg)  09/10/19 (!) 304 lb (137.9 kg)  08/20/19 (!) 304 lb (137.9 kg)     GEN: Patient is in no acute distress HEENT: Normal NECK: No JVD; No carotid bruits LYMPHATICS: No lymphadenopathy CARDIAC: Hear sounds regular, 2/6 systolic murmur at the apex. RESPIRATORY:  Clear to auscultation without rales, wheezing or rhonchi  ABDOMEN: Soft, non-tender, non-distended MUSCULOSKELETAL:  No edema; No deformity  SKIN: Warm and dry NEUROLOGIC:  Alert and oriented x 3 PSYCHIATRIC:  Normal affect   Signed, 10/20/19, MD  09/27/2019 9:04 AM    [facility]

## 2019-10-15 ENCOUNTER — Other Ambulatory Visit: Payer: Self-pay

## 2019-10-15 ENCOUNTER — Ambulatory Visit (INDEPENDENT_AMBULATORY_CARE_PROVIDER_SITE_OTHER): Payer: 59 | Admitting: Medical

## 2019-10-15 VITALS — BP 146/87 | HR 85 | Resp 18 | Ht 66.0 in | Wt 328.0 lb

## 2019-10-15 DIAGNOSIS — E1169 Type 2 diabetes mellitus with other specified complication: Secondary | ICD-10-CM

## 2019-10-15 DIAGNOSIS — E785 Hyperlipidemia, unspecified: Secondary | ICD-10-CM

## 2019-10-15 DIAGNOSIS — I509 Heart failure, unspecified: Secondary | ICD-10-CM

## 2019-10-15 DIAGNOSIS — I10 Essential (primary) hypertension: Secondary | ICD-10-CM | POA: Diagnosis not present

## 2019-10-15 DIAGNOSIS — E119 Type 2 diabetes mellitus without complications: Secondary | ICD-10-CM

## 2019-10-15 MED ORDER — TORSEMIDE 20 MG PO TABS: 20.0000 mg | ORAL_TABLET | Freq: Every day | ORAL | 3 refills | Status: DC

## 2019-10-15 MED ORDER — HYDRALAZINE HCL 25 MG PO TABS: 25.0000 mg | ORAL_TABLET | Freq: Three times a day (TID) | ORAL | 3 refills | Status: DC

## 2019-10-15 MED ORDER — AMILORIDE HCL 5 MG PO TABS: 5.0000 mg | ORAL_TABLET | Freq: Every day | ORAL | 3 refills | Status: DC

## 2019-10-15 NOTE — Patient Instructions (Addendum)
Your bp is mild high today. Continue amloride, hydralazine, metroporolol and torsemide.   For chf continue torsemide.  For diabetes continue metformin.  For wt loss recommend weight watchers.  For high cholesterol continue lipitor.  Future labs place cmp, a1c and lipid panel to be done fasting.  Follow thru with Ct coraonary this week.  Follow up 6 weeks or as needed.  Refilled meds today.

## 2019-10-15 NOTE — Progress Notes (Signed)
Subjective:    Patient ID: Karl Jones, male    DOB: 29-Mar-1975, 44 y.o.   MRN: [id number]  HPI  Pt in for follow up.  Pt bp little high initially and I rechecked. No cardiac or neurologic signs or symptoms presently.  Pt had high cholesterol and is on atrorvastatin.  Hx of diabetes and on metformin. Pt eatiing low sugar diet.  Review of Systems  Constitutional: Negative for chills, fatigue and fever.  Respiratory: Negative for cough, chest tightness, shortness of breath and wheezing.   Cardiovascular: Negative for chest pain and palpitations.  Gastrointestinal: Negative for abdominal pain and anal bleeding.  Genitourinary: Negative for dysuria.  Musculoskeletal: Negative for back pain.  Skin: Negative for rash.  Neurological: Negative for dizziness, seizures, syncope, weakness and headaches.  Hematological: Negative for adenopathy. Does not bruise/bleed easily.  Psychiatric/Behavioral: Negative for behavioral problems and confusion.   Past Medical History:  Diagnosis Date   Acute on chronic respiratory failure with hypoxia and hypercapnia (HCC) 04/23/2019   CHF (congestive heart failure) (HCC)    Chronic diastolic heart failure (HCC) 04/02/2019   Class 3 severe obesity in adult [facility]) 03/12/2019   Diabetes mellitus (HCC) 03/13/2019   Last Assessment & Plan:  Formatting of this note is different from the original. Lab Results  Component Value Date   HBA1C 6.6 (H) 03/13/2019  - Continue SSI - Hypoglycemia protocol - TID AC fingersticks - Consistent carb diet  - Nutrition services consulted for diet instructions - Patient will require diabetic medication upon discharge   Diabetes mellitus without complication (HCC)    DOE (dyspnea on exertion) 08/20/2019   Elevated troponin I level 03/13/2019   Essential hypertension 08/20/2019   Hypertension    Morbid obesity (HCC) 08/20/2019   Physical deconditioning 03/13/2019   Last Assessment & Plan:  Formatting of this note might be  different from the original. Patient had a recent fall at home - PT/OT consulted.  May likely require facility placement.   Sleep apnea 08/20/2019   Snoring 04/23/2019   Witnessed apneic spells 04/23/2019     Social History   Socioeconomic History   Marital status: Single    Spouse name: Not on file   Number of children: Not on file   Years of education: Not on file   Highest education level: Not on file  Occupational History   Not on file  Tobacco Use   Smoking status: Former Smoker   Smokeless tobacco: Never Used   Tobacco comment: last 3-4 years smoked about 3 cigrettes.  Substance and Sexual Activity   Alcohol use: Yes    Comment: very rare.   Drug use: Yes    Types: Marijuana    Comment: occasional/rare.   Sexual activity: Not on file  Other Topics Concern   Not on file  Social History Narrative   Not on file   Social Determinants of Health   Financial Resource Strain:    Difficulty of Paying Living Expenses: Not on file  Food Insecurity:    Worried About Running Out of Food in the Last Year: Not on file   Ran Out of Food in the Last Year: Not on file  Transportation Needs:    Lack of Transportation (Medical): Not on file   Lack of Transportation (Non-Medical): Not on file  Physical Activity:    Days of Exercise per Week: Not on file   Minutes of Exercise per Session: Not on file  Stress:    Feeling of  Stress : Not on file  Social Connections:    Frequency of Communication with Friends and Family: Not on file   Frequency of Social Gatherings with Friends and Family: Not on file   Attends Religious Services: Not on file   Active Member of Clubs or Organizations: Not on file   Attends Banker Meetings: Not on file   Marital Status: Not on file  Intimate Partner Violence:    Fear of Current or Ex-Partner: Not on file   Emotionally Abused: Not on file   Physically Abused: Not on file   Sexually Abused: Not on file      Past Surgical History:  Procedure Laterality Date   NO PAST SURGERIES      Family History  Problem Relation Age of Onset   Hypertension Mother    Dementia Maternal Grandmother     No Known Allergies  Current Outpatient Medications on File Prior to Visit  Medication Sig Dispense Refill   aMILoride (MIDAMOR) 5 MG tablet Take 1 tablet (5 mg total) by mouth daily. 30 tablet 0   aspirin EC 81 MG tablet Take 1 tablet (81 mg total) by mouth daily. Swallow whole. 90 tablet 3   atorvastatin (LIPITOR) 10 MG tablet Take 1 tablet (10 mg total) by mouth daily. 30 tablet 11   hydrALAZINE (APRESOLINE) 25 MG tablet Take 1 tablet (25 mg total) by mouth 3 (three) times daily. 90 tablet 0   metFORMIN (GLUCOPHAGE-XR) 500 MG 24 hr tablet Take 500 mg by mouth daily with breakfast.      metoprolol tartrate (LOPRESSOR) 25 MG tablet 1/2 tab po q day 45 tablet 0   nitroGLYCERIN (NITROSTAT) 0.4 MG SL tablet Place 1 tablet (0.4 mg total) under the tongue every 5 (five) minutes as needed. 25 tablet 6   potassium chloride (KLOR-CON) 10 MEQ tablet TAKE 1 TABLET BY MOUTH ONCE DAILY 7 tablet 0   torsemide (DEMADEX) 20 MG tablet Take 1 tablet (20 mg total) by mouth daily. 30 tablet 0   No current facility-administered medications on file prior to visit.    BP (!) 146/87    Pulse 85    Resp 18    Ht 5\' 6"  (1.676 m)    Wt (!) 328 lb (148.8 kg)    SpO2 98%    BMI 52.94 kg/m       Objective:   Physical Exam  General Mental Status- Alert. General Appearance- Not in acute distress.   Skin General: Color- Normal Color. Moisture- Normal Moisture.  Neck Carotid Arteries- Normal color. Moisture- Normal Moisture. No carotid bruits. No JVD.  Chest and Lung Exam Auscultation: Breath Sounds:-Normal.  Cardiovascular Auscultation:Rythm- Regular. Murmurs & Other Heart Sounds:Auscultation of the heart reveals- No Murmurs.  Abdomen Inspection:-Inspeection Normal. Palpation/Percussion:Note:No  mass. Palpation and Percussion of the abdomen reveal- Non Tender, Non Distended + BS, no rebound or guarding.    Neurologic Cranial Nerve exam:- CN III-XII intact(No nystagmus), symmetric smile. Strength:- 5/5 equal and symmetric strength both upper and lower extremities.      Assessment & Plan:  Your bp is mild high today. Continue amloride, hydralazine, metroporolol and torsemide.   For chf continue torsemide.  For diabetes continue metformin.  For wt loss recommend weight watchers.  For high cholesterol continue lipitor.  Future labs place cmp, a1c and lipid panel to be done fasting.  Follow thru with Ct coraonary this week.  Follow up 6 weeks or as needed  Refilled meds today.

## 2019-10-16 NOTE — Addendum Note (Signed)
Addended by: Mervin Kung A on: 10/16/2019 09:04 AM   Modules accepted: Orders

## 2019-10-17 ENCOUNTER — Other Ambulatory Visit (INDEPENDENT_AMBULATORY_CARE_PROVIDER_SITE_OTHER): Payer: 59

## 2019-10-17 ENCOUNTER — Other Ambulatory Visit: Payer: Self-pay

## 2019-10-17 DIAGNOSIS — E1169 Type 2 diabetes mellitus with other specified complication: Secondary | ICD-10-CM

## 2019-10-17 DIAGNOSIS — E785 Hyperlipidemia, unspecified: Secondary | ICD-10-CM

## 2019-10-17 DIAGNOSIS — E119 Type 2 diabetes mellitus without complications: Secondary | ICD-10-CM

## 2019-10-17 DIAGNOSIS — I10 Essential (primary) hypertension: Secondary | ICD-10-CM

## 2019-10-18 LAB — COMPREHENSIVE METABOLIC PANEL
AG Ratio: 1.3 (calc) (ref 1.0–2.5)
ALT: 20 U/L (ref 9–46)
AST: 21 U/L (ref 10–40)
Albumin: 4.4 g/dL (ref 3.6–5.1)
Alkaline phosphatase (APISO): 74 U/L (ref 36–130)
BUN: 12 mg/dL (ref 7–25)
CO2: 34 mmol/L — ABNORMAL HIGH (ref 20–32)
Calcium: 9.4 mg/dL (ref 8.6–10.3)
Chloride: 99 mmol/L (ref 98–110)
Creat: 0.93 mg/dL (ref 0.60–1.35)
Globulin: 3.4 g/dL (calc) (ref 1.9–3.7)
Glucose, Bld: 103 mg/dL — ABNORMAL HIGH (ref 65–99)
Potassium: 3.7 mmol/L (ref 3.5–5.3)
Sodium: 140 mmol/L (ref 135–146)
Total Bilirubin: 0.4 mg/dL (ref 0.2–1.2)
Total Protein: 7.8 g/dL (ref 6.1–8.1)

## 2019-10-18 LAB — HEMOGLOBIN A1C
Hgb A1c MFr Bld: 6.1 % of total Hgb — ABNORMAL HIGH (ref <5.7)
Mean Plasma Glucose: 128 (calc)
eAG (mmol/L): 7.1 (calc)

## 2019-10-18 LAB — LIPID PANEL
Cholesterol: 216 mg/dL — ABNORMAL HIGH (ref <200)
HDL: 51 mg/dL (ref >=40)
LDL Cholesterol (Calc): 142 mg/dL (calc) — ABNORMAL HIGH
Non-HDL Cholesterol (Calc): 165 mg/dL (calc) — ABNORMAL HIGH (ref <130)
Total CHOL/HDL Ratio: 4.2 (calc) (ref <5.0)
Triglycerides: 114 mg/dL (ref <150)

## 2019-10-22 ENCOUNTER — Telehealth (HOSPITAL_COMMUNITY): Payer: Self-pay | Admitting: Emergency Medicine

## 2019-10-22 NOTE — Telephone Encounter (Signed)
VM box not set up, cannot leave messages

## 2019-10-23 ENCOUNTER — Other Ambulatory Visit: Payer: Self-pay

## 2019-10-23 ENCOUNTER — Encounter (HOSPITAL_COMMUNITY): Payer: Self-pay

## 2019-10-23 ENCOUNTER — Ambulatory Visit (HOSPITAL_COMMUNITY)
Admission: RE | Admit: 2019-10-23 | Discharge: 2019-10-23 | Disposition: A | Discharge status: Home / Self Care | Payer: 59 | Source: Ambulatory Visit | Attending: Cardiology | Admitting: Cardiology

## 2019-10-23 DIAGNOSIS — I208 Other forms of angina pectoris: Secondary | ICD-10-CM | POA: Diagnosis not present

## 2019-10-23 DIAGNOSIS — I209 Angina pectoris, unspecified: Secondary | ICD-10-CM | POA: Diagnosis present

## 2019-10-23 MED ORDER — METOPROLOL TARTRATE 5 MG/5ML IV SOLN
INTRAVENOUS | Status: AC
  Administered 2019-10-23: 5 mg via INTRAVENOUS
  Filled 2019-10-23: qty 15

## 2019-10-23 MED ORDER — NITROGLYCERIN 0.4 MG SL SUBL: 0.8000 mg | SUBLINGUAL_TABLET | Freq: Once | SUBLINGUAL | Status: AC

## 2019-10-23 MED ORDER — METOPROLOL TARTRATE 5 MG/5ML IV SOLN: 5.0000 mg | INTRAVENOUS | Status: DC | PRN

## 2019-10-23 MED ORDER — NITROGLYCERIN 0.4 MG SL SUBL
SUBLINGUAL_TABLET | SUBLINGUAL | Status: AC
  Administered 2019-10-23: 0.8 mg via SUBLINGUAL
  Filled 2019-10-23: qty 2

## 2019-10-23 MED ORDER — IOHEXOL 350 MG/ML SOLN
100.0000 mL | Freq: Once | INTRAVENOUS | Status: AC | PRN
  Administered 2019-10-23: 100 mL via INTRAVENOUS

## 2019-10-25 ENCOUNTER — Telehealth: Payer: Self-pay

## 2019-10-25 NOTE — Telephone Encounter (Signed)
Spoke with patient regarding results and recommendation.  Patient verbalizes understanding and is agreeable to plan of care. Advised patient to call back with any issues or concerns.  

## 2019-10-25 NOTE — Telephone Encounter (Signed)
-----   Message from Garwin Brothers, MD sent at 10/23/2019  4:55 PM EDT ----- Mildly dilated ascending aorta.  We will recheck in 1 year.  The results of the study is unremarkable. Please inform patient. I will discuss in detail at next appointment. Cc  primary care/referring physician Garwin Brothers, MD 10/23/2019 4:54 PM

## 2019-11-12 ENCOUNTER — Ambulatory Visit: Payer: 59 | Admitting: Cardiology

## 2019-11-14 ENCOUNTER — Encounter: Payer: Self-pay | Admitting: Cardiology

## 2019-11-14 ENCOUNTER — Ambulatory Visit (INDEPENDENT_AMBULATORY_CARE_PROVIDER_SITE_OTHER): Payer: 59 | Admitting: Cardiology

## 2019-11-14 ENCOUNTER — Other Ambulatory Visit: Payer: Self-pay

## 2019-11-14 VITALS — BP 182/102 | HR 76 | Ht 67.0 in | Wt 333.1 lb

## 2019-11-14 DIAGNOSIS — E782 Mixed hyperlipidemia: Secondary | ICD-10-CM | POA: Diagnosis not present

## 2019-11-14 DIAGNOSIS — Z79899 Other long term (current) drug therapy: Secondary | ICD-10-CM

## 2019-11-14 DIAGNOSIS — I10 Essential (primary) hypertension: Secondary | ICD-10-CM

## 2019-11-14 DIAGNOSIS — E119 Type 2 diabetes mellitus without complications: Secondary | ICD-10-CM | POA: Diagnosis not present

## 2019-11-14 DIAGNOSIS — I77810 Thoracic aortic ectasia: Secondary | ICD-10-CM

## 2019-11-14 MED ORDER — LOSARTAN POTASSIUM 100 MG PO TABS: 100.0000 mg | ORAL_TABLET | Freq: Every day | ORAL | 3 refills | Status: DC

## 2019-11-14 NOTE — Patient Instructions (Addendum)
.Medication Instructions:  Your physician has recommended you make the following change in your medication:   Discontinue Hydralazine. Start Cozaar 100 mg daily.  *If you need a refill on your cardiac medications before your next appointment, please call your pharmacy*   Lab Work:  Your physician recommends that you have labs done in the office today. Your test included  basic metabolic panel, complete blood count, TSH, liver function and lipids.  Your physician recommends that you return for lab work in: 1 week.   If you have labs (blood work) drawn today and your tests are completely normal, you will receive your results only by:  MyChart Message (if you have MyChart) OR  A paper copy in the mail If you have any lab test that is abnormal or we need to change your treatment, we will call you to review the results.   Testing/Procedures: None ordered   Follow-Up: At [facility], you and your health needs are our priority.  As part of our continuing mission to provide you with exceptional heart care, we have created designated Provider Care Teams.  These Care Teams include your primary Cardiologist (physician) and Advanced Practice Providers (APPs -  Physician Assistants and Nurse Practitioners) who all work together to provide you with the care you need, when you need it.  We recommend signing up for the patient portal called "MyChart".  Sign up information is provided on this After Visit Summary.  MyChart is used to connect with patients for Virtual Visits (Telemedicine).  Patients are able to view lab/test results, encounter notes, upcoming appointments, etc.  Non-urgent messages can be sent to your provider as well.   To learn more about what you can do with MyChart, go to [email or web address].    Your next appointment:   1 month(s)  The format for your next appointment:   In Person  Provider:   Belva Crome, MD   Other Instructions  Blood Pressure Record  Sheet To take your blood pressure, you will need a blood pressure machine. You can buy a blood pressure machine (blood pressure monitor) at your clinic, drug store, or online. When choosing one, consider:  An automatic monitor that has an arm cuff.  A cuff that wraps snugly around your upper arm. You should be able to fit only one finger between your arm and the cuff.  A device that stores blood pressure reading results.  Do not choose a monitor that measures your blood pressure from your wrist or finger. Follow your health care provider's instructions for how to take your blood pressure. To use this form:  Get one reading in the morning (a.m.) 1 to 2 hours after you take any medicines.  Get one reading in the evening (p.m.) before supper.  Take at least 2 readings with each blood pressure check. This makes sure the results are correct. Wait 1-2 minutes between measurements.  Write down the results in the spaces on this form.  Repeat this once a week, or as told by your health care provider.  Make a follow-up appointment with your health care provider to discuss the results. Blood pressure log Date: _______________________  a.m. _____________________(1st reading) _____________________(2nd reading)  p.m. _____________________(1st reading) _____________________(2nd reading) Date: _______________________  a.m. _____________________(1st reading) _____________________(2nd reading)  p.m. _____________________(1st reading) _____________________(2nd reading) Date: _______________________  a.m. _____________________(1st reading) _____________________(2nd reading)  p.m. _____________________(1st reading) _____________________(2nd reading) Date: _______________________  a.m. _____________________(1st reading) _____________________(2nd reading)  p.m. _____________________(1st reading) _____________________(2nd reading) Date: _______________________  a.m. _____________________(1st  reading) _____________________(2nd reading)  p.m. _____________________(1st reading) _____________________(2nd reading) This information is not intended to replace advice given to you by your health care provider. Make sure you discuss any questions you have with your health care provider. Document Revised: 02/25/2017 Document Reviewed: 12/28/2016 Elsevier Patient Education  2020 [employer].

## 2019-11-14 NOTE — Progress Notes (Signed)
Cardiology Office Note:    Date:  11/14/2019   ID:  Karl Jones, DOB 02/13/1975, MRN [id number]  PCP:  Esperanza Richters, PA-C  Cardiologist:  Garwin Brothers, MD   Referring MD: Esperanza Richters, PA-C    ASSESSMENT:    1. Ascending aorta dilatation (HCC)   2. Essential hypertension   3. Diabetes mellitus without complication (HCC)   4. Mixed dyslipidemia   5. Morbid obesity (HCC)    PLAN:    In order of problems listed above:  1. Primary prevention stressed with the patient.  Importance of compliance with diet medication stressed any vocalized understanding. 2. Essential hypertension: Blood pressure is elevated.  He forgets to take his hydralazine therefore I will discontinue that medication and start him on Cozaar 100 mg daily he will keep a pulse blood pressure log for a week and get back to [place].  He is fasting and will get complete blood work to [place] to include lipids 3. Mixed dyslipidemia and diabetes mellitus: Diet was emphasized.  He is on statins and will check blood work today as he is fasting.  His diabetes is managed by his primary care physician.  Diet and lifestyle modification was urged. 4. Morbid obesity: Diet was emphasized and he promises to do better.  Risks of obesity explained.  He was told to exercise at least half an hour a day of walking 5 days a week and he tells me that he is going to try to do so. 5. Ascending aortic dilatation: Discussed.  Follow-up CT in a year. 6. Follow-up appointment in a month or earlier if he has any concerns.  Had multiple questions which were answered to his satisfaction.   Medication Adjustments/Labs and Tests Ordered: Current medicines are reviewed at length with the patient today.  Concerns regarding medicines are outlined above.  No orders of the defined types were placed in this encounter.  No orders of the defined types were placed in this encounter.    No chief complaint on file.    History of Present Illness:     Karl Jones is a 44 y.o. male.  Patient has past medical history of essential hypertension, ascending aortic dilatation dyslipidemia and morbid obesity.  He is a diabetic.  He denies any problems at this time and takes care of activities of daily living.  No chest pain orthopnea or PND.  At the time of my evaluation, the patient is alert awake oriented and in no distress.  He leads a sedentary lifestyle and is lax with his diet.  Past Medical History:  Diagnosis Date  . Acute on chronic respiratory failure with hypoxia and hypercapnia (HCC) 04/23/2019  . Angina pectoris (HCC) 09/27/2019  . Ascending aorta dilatation (HCC) 09/27/2019  . CHF (congestive heart failure) (HCC)   . Chronic diastolic heart failure (HCC) 04/02/2019  . Class 3 severe obesity in adult [facility]) 03/12/2019  . Diabetes mellitus (HCC) 03/13/2019   Last Assessment & Plan:  Formatting of this note is different from the original. Lab Results  Component Value Date   HBA1C 6.6 (H) 03/13/2019  - Continue SSI - Hypoglycemia protocol - TID AC fingersticks - Consistent carb diet  - Nutrition services consulted for diet instructions - Patient will require diabetic medication upon discharge  . Diabetes mellitus without complication (HCC)   . DOE (dyspnea on exertion) 08/20/2019  . Elevated troponin I level 03/13/2019  . Essential hypertension 08/20/2019  . Hypertension   . Mixed dyslipidemia 08/20/2019  .  Morbid obesity (HCC) 08/20/2019  . Physical deconditioning 03/13/2019   Last Assessment & Plan:  Formatting of this note might be different from the original. Patient had a recent fall at home - PT/OT consulted.  May likely require facility placement.  . Sleep apnea 08/20/2019  . Snoring 04/23/2019  . Witnessed apneic spells 04/23/2019    Past Surgical History:  Procedure Laterality Date  . NO PAST SURGERIES      Current Medications: Current Meds  Medication Sig  . aMILoride (MIDAMOR) 5 MG tablet Take 1 tablet (5 mg total) by mouth daily.  Marland Kitchen  aspirin EC 81 MG tablet Take 1 tablet (81 mg total) by mouth daily. Swallow whole.  Marland Kitchen atorvastatin (LIPITOR) 10 MG tablet Take 1 tablet (10 mg total) by mouth daily.  . hydrALAZINE (APRESOLINE) 25 MG tablet Take 1 tablet (25 mg total) by mouth 3 (three) times daily.  . metFORMIN (GLUCOPHAGE-XR) 500 MG 24 hr tablet Take 500 mg by mouth daily with breakfast.   . metoprolol tartrate (LOPRESSOR) 25 MG tablet Take 25 mg by mouth daily.  . nitroGLYCERIN (NITROSTAT) 0.4 MG SL tablet Place 1 tablet (0.4 mg total) under the tongue every 5 (five) minutes as needed.  . potassium chloride (KLOR-CON) 10 MEQ tablet TAKE 1 TABLET BY MOUTH ONCE DAILY  . torsemide (DEMADEX) 20 MG tablet Take 1 tablet (20 mg total) by mouth daily.     Allergies:   Patient has no known allergies.   Social History   Socioeconomic History  . Marital status: Single    Spouse name: Not on file  . Number of children: Not on file  . Years of education: Not on file  . Highest education level: Not on file  Occupational History  . Not on file  Tobacco Use  . Smoking status: Former Games developer  . Smokeless tobacco: Never Used  . Tobacco comment: last 3-4 years smoked about 3 cigrettes.  Substance and Sexual Activity  . Alcohol use: Yes    Comment: very rare.  . Drug use: Yes    Types: Marijuana    Comment: occasional/rare.  . Sexual activity: Not on file  Other Topics Concern  . Not on file  Social History Narrative  . Not on file   Social Determinants of Health   Financial Resource Strain:   . Difficulty of Paying Living Expenses: Not on file  Food Insecurity:   . Worried About Programme researcher, broadcasting/film/video in the Last Year: Not on file  . Ran Out of Food in the Last Year: Not on file  Transportation Needs:   . Lack of Transportation (Medical): Not on file  . Lack of Transportation (Non-Medical): Not on file  Physical Activity:   . Days of Exercise per Week: Not on file  . Minutes of Exercise per Session: Not on file   Stress:   . Feeling of Stress : Not on file  Social Connections:   . Frequency of Communication with Friends and Family: Not on file  . Frequency of Social Gatherings with Friends and Family: Not on file  . Attends Religious Services: Not on file  . Active Member of Clubs or Organizations: Not on file  . Attends Banker Meetings: Not on file  . Marital Status: Not on file     Family History: The patient's family history includes Dementia in his maternal grandmother; Hypertension in his mother.  ROS:   Please see the history of present illness.    All  other systems reviewed and are negative.  EKGs/Labs/Other Studies Reviewed:    The following studies were reviewed today:  -   Recent Labs: 07/11/2019: Pro B Natriuretic peptide (BNP) 223.0 08/20/2019: Magnesium 1.8 10/17/2019: ALT 20; BUN 12; Creat 0.93; Potassium 3.7; Sodium 140  Recent Lipid Panel    Component Value Date/Time   CHOL 216 (H) 10/17/2019 0927   TRIG 114 10/17/2019 0927   HDL 51 10/17/2019 0927   CHOLHDL 4.2 10/17/2019 0927   VLDL 43.6 (H) 07/23/2019 1544   LDLCALC 142 (H) 10/17/2019 0927   LDLDIRECT 135.0 07/23/2019 1544    Physical Exam:    VS:  BP (!) 182/102   Pulse 76   Ht 5\' 7"  (1.702 m)   Wt (!) 333 lb 1.3 oz (151.1 kg)   SpO2 95%   BMI 52.17 kg/m     Wt Readings from Last 3 Encounters:  11/14/19 (!) 333 lb 1.3 oz (151.1 kg)  10/15/19 (!) 328 lb (148.8 kg)  09/27/19 (!) 321 lb 3.2 oz (145.7 kg)     GEN: Patient is in no acute distress HEENT: Normal NECK: No JVD; No carotid bruits LYMPHATICS: No lymphadenopathy CARDIAC: Hear sounds regular, 2/6 systolic murmur at the apex. RESPIRATORY:  Clear to auscultation without rales, wheezing or rhonchi  ABDOMEN: Soft, non-tender, non-distended MUSCULOSKELETAL:  No edema; No deformity  SKIN: Warm and dry NEUROLOGIC:  Alert and oriented x 3 PSYCHIATRIC:  Normal affect   Signed, 09/29/19, MD  11/14/2019 8:22 AM    [facility]

## 2019-11-15 LAB — BASIC METABOLIC PANEL
BUN/Creatinine Ratio: 13 (ref 9–20)
BUN: 12 mg/dL (ref 6–24)
CO2: 33 mmol/L — ABNORMAL HIGH (ref 20–29)
Calcium: 9.4 mg/dL (ref 8.7–10.2)
Chloride: 97 mmol/L (ref 96–106)
Creatinine, Ser: 0.95 mg/dL (ref 0.76–1.27)
GFR calc Af Amer: 113 mL/min/{1.73_m2} (ref >59)
GFR calc non Af Amer: 98 mL/min/{1.73_m2} (ref >59)
Glucose: 99 mg/dL (ref 65–99)
Potassium: 3.8 mmol/L (ref 3.5–5.2)
Sodium: 140 mmol/L (ref 134–144)

## 2019-11-15 LAB — CBC WITH DIFFERENTIAL/PLATELET
Basophils Absolute: 0 10*3/uL (ref 0.0–0.2)
Basos: 0 %
EOS (ABSOLUTE): 0.1 10*3/uL (ref 0.0–0.4)
Eos: 2 %
Hematocrit: 50.7 % (ref 37.5–51.0)
Hemoglobin: 17.1 g/dL (ref 13.0–17.7)
Immature Grans (Abs): 0 10*3/uL (ref 0.0–0.1)
Immature Granulocytes: 0 %
Lymphocytes Absolute: 1.9 10*3/uL (ref 0.7–3.1)
Lymphs: 27 %
MCH: 29.2 pg (ref 26.6–33.0)
MCHC: 33.7 g/dL (ref 31.5–35.7)
MCV: 87 fL (ref 79–97)
Monocytes Absolute: 0.8 10*3/uL (ref 0.1–0.9)
Monocytes: 11 %
Neutrophils Absolute: 4.1 10*3/uL (ref 1.4–7.0)
Neutrophils: 60 %
Platelets: 215 10*3/uL (ref 150–450)
RBC: 5.85 x10E6/uL — ABNORMAL HIGH (ref 4.14–5.80)
RDW: 14.9 % (ref 11.6–15.4)
WBC: 6.9 10*3/uL (ref 3.4–10.8)

## 2019-11-15 LAB — TSH: TSH: 0.726 u[IU]/mL (ref 0.450–4.500)

## 2019-11-15 LAB — LIPID PANEL
Chol/HDL Ratio: 4.7 ratio (ref 0.0–5.0)
Cholesterol, Total: 217 mg/dL — ABNORMAL HIGH (ref 100–199)
HDL: 46 mg/dL (ref >39)
LDL Chol Calc (NIH): 150 mg/dL — ABNORMAL HIGH (ref 0–99)
Triglycerides: 114 mg/dL (ref 0–149)
VLDL Cholesterol Cal: 21 mg/dL (ref 5–40)

## 2019-11-15 LAB — HEPATIC FUNCTION PANEL
ALT: 22 IU/L (ref 0–44)
AST: 27 IU/L (ref 0–40)
Albumin: 4.5 g/dL (ref 4.0–5.0)
Alkaline Phosphatase: 84 IU/L (ref 44–121)
Bilirubin Total: 0.3 mg/dL (ref 0.0–1.2)
Bilirubin, Direct: 0.1 mg/dL (ref 0.00–0.40)
Total Protein: 7.9 g/dL (ref 6.0–8.5)

## 2019-11-15 MED ORDER — ATORVASTATIN CALCIUM 40 MG PO TABS: 40.0000 mg | ORAL_TABLET | Freq: Every day | ORAL | 3 refills | Status: DC

## 2019-11-15 NOTE — Addendum Note (Signed)
Addended by: Eleonore Chiquito on: 11/15/2019 03:59 PM   Modules accepted: Orders

## 2019-11-19 ENCOUNTER — Telehealth: Payer: Self-pay | Admitting: Medical

## 2019-11-19 ENCOUNTER — Other Ambulatory Visit: Payer: Self-pay

## 2019-11-19 ENCOUNTER — Ambulatory Visit (HOSPITAL_BASED_OUTPATIENT_CLINIC_OR_DEPARTMENT_OTHER)
Admission: RE | Admit: 2019-11-19 | Discharge: 2019-11-19 | Disposition: A | Discharge status: Home / Self Care | Payer: 59 | Source: Ambulatory Visit | Attending: Medical | Admitting: Medical

## 2019-11-19 ENCOUNTER — Ambulatory Visit: Payer: 59 | Admitting: Medical

## 2019-11-19 ENCOUNTER — Encounter: Payer: Self-pay | Admitting: Medical

## 2019-11-19 VITALS — BP 150/100 | HR 96 | Resp 18 | Ht 67.0 in | Wt 337.0 lb

## 2019-11-19 DIAGNOSIS — I509 Heart failure, unspecified: Secondary | ICD-10-CM

## 2019-11-19 DIAGNOSIS — E119 Type 2 diabetes mellitus without complications: Secondary | ICD-10-CM

## 2019-11-19 DIAGNOSIS — Z7185 Encounter for immunization safety counseling: Secondary | ICD-10-CM

## 2019-11-19 DIAGNOSIS — G4730 Sleep apnea, unspecified: Secondary | ICD-10-CM

## 2019-11-19 DIAGNOSIS — I10 Essential (primary) hypertension: Secondary | ICD-10-CM

## 2019-11-19 DIAGNOSIS — R918 Other nonspecific abnormal finding of lung field: Secondary | ICD-10-CM

## 2019-11-19 MED ORDER — AMLODIPINE BESYLATE 10 MG PO TABS: 10.0000 mg | ORAL_TABLET | Freq: Every day | ORAL | 3 refills | Status: DC

## 2019-11-19 NOTE — Progress Notes (Signed)
Subjective:    Patient ID: Karl Jones, male    DOB: 21-Oct-1975, 44 y.o.   MRN: [id number]  HPI Pt in for follow up. Pt has seen cardiologist. Below is A/P from cardiologist.  1. Primary prevention stressed with the patient.  Importance of compliance with diet medication stressed any vocalized understanding. 2. Essential hypertension: Blood pressure is elevated.  He forgets to take his hydralazine therefore I will discontinue that medication and start him on Cozaar 100 mg daily he will keep a pulse blood pressure log for a week and get back to [place].  He is fasting and will get complete blood work to [place] to include lipids 3. Mixed dyslipidemia and diabetes mellitus: Diet was emphasized.  He is on statins and will check blood work today as he is fasting.  His diabetes is managed by his primary care physician.  Diet and lifestyle modification was urged. 4. Morbid obesity: Diet was emphasized and he promises to do better.  Risks of obesity explained.  He was told to exercise at least half an hour a day of walking 5 days a week and he tells me that he is going to try to do so. 5. Ascending aortic dilatation: Discussed.  Follow-up CT in a year. 6. Follow-up appointment in a month or earlier if he has any concerns.  Had multiple questions which were answered to his satisfaction.    Recent echo below done sept 2020.   1. Left ventricular ejection fraction, by estimation, is 60 to 65%. The left ventricle has normal function. The left ventricle has no regional wall motion abnormalities. There is moderate left ventricular hypertrophy. Left ventricular diastolic parameters are consistent with Grade II diastolic dysfunction (pseudonormalization). 2. Right ventricular systolic function is normal. The right ventricular size is moderately enlarged. 3. Left atrial size was mildly dilated. 4. Right atrial size was moderately dilated. 5. The mitral valve is normal in structure. No evidence of mitral valve  regurgitation. No evidence of mitral stenosis. 6. The aortic valve is normal in structure. Aortic valve regurgitation is not visualized. No aortic stenosis is present. 7. Aortic dilatation noted. There is mild dilatation at the level of the sinuses of Valsalva, measuring 38 mm. There is mild dilatation of the ascending aorta, measuring 40 mm. 8. The inferior vena cava is normal in size with greater than 50% respiratory variability, suggesting right atrial pressure of 3 mmHg.  Pt is on losartan, torsemide, amiloride and metoprolol. Pt admits he can't remember to take hydralazine three times a day.  Pt recently stating he can't tolerate his cpap which was given to him when hospitalized in Feb and March.    Review of Systems  Constitutional: Negative for chills, fatigue and fever.  Respiratory: Negative for cough, chest tightness, shortness of breath and wheezing.   Cardiovascular: Negative for chest pain and palpitations.  Gastrointestinal: Negative for abdominal pain.  Hematological: Negative for adenopathy. Does not bruise/bleed easily.  Psychiatric/Behavioral: Negative for behavioral problems and confusion. The patient is not hyperactive.     Past Medical History:  Diagnosis Date  . Acute on chronic respiratory failure with hypoxia and hypercapnia (HCC) 04/23/2019  . Angina pectoris (HCC) 09/27/2019  . Ascending aorta dilatation (HCC) 09/27/2019  . CHF (congestive heart failure) (HCC)   . Chronic diastolic heart failure (HCC) 04/02/2019  . Class 3 severe obesity in adult [facility]) 03/12/2019  . Diabetes mellitus (HCC) 03/13/2019   Last Assessment & Plan:  Formatting of this note is different from  the original. Lab Results  Component Value Date   HBA1C 6.6 (H) 03/13/2019  - Continue SSI - Hypoglycemia protocol - TID AC fingersticks - Consistent carb diet  - Nutrition services consulted for diet instructions - Patient will require diabetic medication upon discharge  . Diabetes mellitus without  complication (HCC)   . DOE (dyspnea on exertion) 08/20/2019  . Elevated troponin I level 03/13/2019  . Essential hypertension 08/20/2019  . Hypertension   . Mixed dyslipidemia 08/20/2019  . Morbid obesity (HCC) 08/20/2019  . Physical deconditioning 03/13/2019   Last Assessment & Plan:  Formatting of this note might be different from the original. Patient had a recent fall at home - PT/OT consulted.  May likely require facility placement.  . Sleep apnea 08/20/2019  . Snoring 04/23/2019  . Witnessed apneic spells 04/23/2019     Social History   Socioeconomic History  . Marital status: Single    Spouse name: Not on file  . Number of children: Not on file  . Years of education: Not on file  . Highest education level: Not on file  Occupational History  . Not on file  Tobacco Use  . Smoking status: Former Games developer  . Smokeless tobacco: Never Used  . Tobacco comment: last 3-4 years smoked about 3 cigrettes.  Substance and Sexual Activity  . Alcohol use: Yes    Comment: very rare.  . Drug use: Yes    Types: Marijuana    Comment: occasional/rare.  . Sexual activity: Not on file  Other Topics Concern  . Not on file  Social History Narrative  . Not on file   Social Determinants of Health   Financial Resource Strain:   . Difficulty of Paying Living Expenses: Not on file  Food Insecurity:   . Worried About Programme researcher, broadcasting/film/video in the Last Year: Not on file  . Ran Out of Food in the Last Year: Not on file  Transportation Needs:   . Lack of Transportation (Medical): Not on file  . Lack of Transportation (Non-Medical): Not on file  Physical Activity:   . Days of Exercise per Week: Not on file  . Minutes of Exercise per Session: Not on file  Stress:   . Feeling of Stress : Not on file  Social Connections:   . Frequency of Communication with Friends and Family: Not on file  . Frequency of Social Gatherings with Friends and Family: Not on file  . Attends Religious Services: Not on file  . Active  Member of Clubs or Organizations: Not on file  . Attends Banker Meetings: Not on file  . Marital Status: Not on file  Intimate Partner Violence:   . Fear of Current or Ex-Partner: Not on file  . Emotionally Abused: Not on file  . Physically Abused: Not on file  . Sexually Abused: Not on file    Past Surgical History:  Procedure Laterality Date  . NO PAST SURGERIES      Family History  Problem Relation Age of Onset  . Hypertension Mother   . Dementia Maternal Grandmother     No Known Allergies  Current Outpatient Medications on File Prior to Visit  Medication Sig Dispense Refill  . aMILoride (MIDAMOR) 5 MG tablet Take 1 tablet (5 mg total) by mouth daily. 30 tablet 3  . aspirin EC 81 MG tablet Take 1 tablet (81 mg total) by mouth daily. Swallow whole. 90 tablet 3  . atorvastatin (LIPITOR) 40 MG tablet Take 1  tablet (40 mg total) by mouth daily. Alternating with 20 mg (1/2 tablet) every other day. 90 tablet 3  . losartan (COZAAR) 100 MG tablet Take 1 tablet (100 mg total) by mouth daily. 90 tablet 3  . metFORMIN (GLUCOPHAGE-XR) 500 MG 24 hr tablet Take 500 mg by mouth daily with breakfast.     . metoprolol tartrate (LOPRESSOR) 25 MG tablet Take 25 mg by mouth daily.    . nitroGLYCERIN (NITROSTAT) 0.4 MG SL tablet Place 1 tablet (0.4 mg total) under the tongue every 5 (five) minutes as needed. 25 tablet 6  . potassium chloride (KLOR-CON) 10 MEQ tablet TAKE 1 TABLET BY MOUTH ONCE DAILY 7 tablet 0  . torsemide (DEMADEX) 20 MG tablet Take 1 tablet (20 mg total) by mouth daily. 30 tablet 3   No current facility-administered medications on file prior to visit.    BP (!) 179/130   Pulse 96   Resp 18   Ht 5\' 7"  (1.702 m)   Wt (!) 337 lb (152.9 kg)   SpO2 92%   BMI 52.78 kg/m       Objective:   Physical Exam  General Mental Status- Alert. General Appearance- Not in acute distress.   Skin General: Color- Normal Color. Moisture- Normal  Moisture.  Neck Carotid Arteries- Normal color. Moisture- Normal Moisture. No carotid bruits. No JVD.  Chest and Lung Exam Auscultation: Breath Sounds:-Normal.  Cardiovascular Auscultation:Rythm- Regular. Murmurs & Other Heart Sounds:Auscultation of the heart reveals- No Murmurs.  Abdomen Inspection:-Inspeection Normal. Palpation/Percussion:Note:No mass. Palpation and Percussion of the abdomen reveal- Non Tender, Non Distended + BS, no rebound or guarding.    Neurologic Cranial Nerve exam:- CN III-XII intact(No nystagmus), symmetric smile. Strength:- 5/5 equal and symmetric strength both upper and lower extremities.  Lower ext- no pedal edema.           Assessment & Plan:  Your blood pressure is still elevated today at 150/100 after I checked 2 times.  We will add amlodipine 10 mg tablet and have you stop hydralazine since you admit not able to remember to take it as instructed.  Continue with losartan, amiloride, torsemid and metoprolol.  History of CHF and recent echo showed good ejection fraction.  Since you report some difficulty using CPAP at night and feeling shortness of breath we will go ahead and get chest x-ray.  For diabetes with good control A1c at 6.1, recommend continue Metformin.  Eat low sugar diet.  For sleep apnea we will go ahead and refer you to pulmonologist to evaluate condition and your machine.  You declined both flu vaccine and Covid vaccine.  Explained benefit of vaccines particularly Covid vaccine in light of your medical history.  Follow-up in 10 to 14 days for nurse blood pressure check.

## 2019-11-19 NOTE — Telephone Encounter (Signed)
Please get pt schedule for labs in one week. Does not need to see me just lab.

## 2019-11-19 NOTE — Patient Instructions (Signed)
Your blood pressure is still elevated today at 150/100 after I checked 2 times.  We will add amlodipine 10 mg tablet and have you stop hydralazine since you admit not able to remember to take it as instructed.  Continue with losartan, amiloride, torsemid and metoprolol.  History of CHF and recent echo showed good ejection fraction.  Since you report some difficulty using CPAP at night and feeling shortness of breath we will go ahead and get chest x-ray.  For diabetes with good control A1c at 6.1, recommend continue Metformin.  Eat low sugar diet.  For sleep apnea we will go ahead and refer you to pulmonologist to evaluate condition and your machine.  You declined both flu vaccine and Covid vaccine.  Explained benefit of vaccines particularly Covid vaccine in light of your medical history.  Follow-up in 10 to 14 days for nurse blood pressure check.

## 2019-11-20 NOTE — Telephone Encounter (Signed)
Called pt and lvm to return call.  

## 2019-11-20 NOTE — Telephone Encounter (Signed)
Called pt and unable to lvm 

## 2019-11-21 NOTE — Telephone Encounter (Signed)
Appt scheduled

## 2019-11-23 ENCOUNTER — Ambulatory Visit: Payer: 59 | Admitting: Cardiology

## 2019-11-26 ENCOUNTER — Other Ambulatory Visit: Payer: Self-pay

## 2019-11-26 ENCOUNTER — Other Ambulatory Visit (INDEPENDENT_AMBULATORY_CARE_PROVIDER_SITE_OTHER): Payer: 59

## 2019-11-26 DIAGNOSIS — I509 Heart failure, unspecified: Secondary | ICD-10-CM

## 2019-11-26 LAB — COMPREHENSIVE METABOLIC PANEL
ALT: 26 U/L (ref 0–53)
AST: 28 U/L (ref 0–37)
Albumin: 4.4 g/dL (ref 3.5–5.2)
Alkaline Phosphatase: 77 U/L (ref 39–117)
BUN: 15 mg/dL (ref 6–23)
CO2: 36 mEq/L — ABNORMAL HIGH (ref 19–32)
Calcium: 9.1 mg/dL (ref 8.4–10.5)
Chloride: 99 mEq/L (ref 96–112)
Creatinine, Ser: 0.86 mg/dL (ref 0.40–1.50)
GFR: 106.38 mL/min (ref >60.00)
Glucose, Bld: 94 mg/dL (ref 70–99)
Potassium: 3.8 mEq/L (ref 3.5–5.1)
Sodium: 140 mEq/L (ref 135–145)
Total Bilirubin: 0.4 mg/dL (ref 0.2–1.2)
Total Protein: 8 g/dL (ref 6.0–8.3)

## 2019-11-26 LAB — BRAIN NATRIURETIC PEPTIDE: Pro B Natriuretic peptide (BNP): 62 pg/mL (ref 0.0–100.0)

## 2019-12-20 ENCOUNTER — Institutional Professional Consult (permissible substitution): Payer: 59 | Admitting: Pulmonary Disease

## 2019-12-21 ENCOUNTER — Encounter: Payer: Self-pay | Admitting: Cardiology

## 2019-12-21 ENCOUNTER — Other Ambulatory Visit: Payer: Self-pay

## 2019-12-21 ENCOUNTER — Ambulatory Visit (INDEPENDENT_AMBULATORY_CARE_PROVIDER_SITE_OTHER): Payer: 59 | Admitting: Cardiology

## 2019-12-21 VITALS — BP 152/88 | HR 80 | Ht 67.0 in | Wt 344.1 lb

## 2019-12-21 DIAGNOSIS — I77810 Thoracic aortic ectasia: Secondary | ICD-10-CM

## 2019-12-21 DIAGNOSIS — E782 Mixed hyperlipidemia: Secondary | ICD-10-CM

## 2019-12-21 DIAGNOSIS — I10 Essential (primary) hypertension: Secondary | ICD-10-CM

## 2019-12-21 DIAGNOSIS — I5032 Chronic diastolic (congestive) heart failure: Secondary | ICD-10-CM

## 2019-12-21 MED ORDER — CHLORTHALIDONE 50 MG PO TABS: 25.0000 mg | ORAL_TABLET | Freq: Every day | ORAL | 3 refills | Status: DC

## 2019-12-21 MED ORDER — METOPROLOL TARTRATE 50 MG PO TABS
50.0000 mg | ORAL_TABLET | Freq: Every day | ORAL | 12 refills | Status: DC
Start: 2019-12-21 — End: 2020-02-08

## 2019-12-21 MED ORDER — POTASSIUM CHLORIDE ER 20 MEQ PO TBCR: 20.0000 meq | EXTENDED_RELEASE_TABLET | Freq: Every day | ORAL | 12 refills | Status: DC

## 2019-12-21 NOTE — Progress Notes (Signed)
Cardiology Office Note:    Date:  12/21/2019   ID:  Karl Jones, DOB 05-09-1975, MRN [id number]  PCP:  Esperanza Richters, PA-C  Cardiologist:  Garwin Brothers, MD   Referring MD: Esperanza Richters, PA-C    ASSESSMENT:    1. Chronic diastolic heart failure (HCC)   2. Essential hypertension   3. Ascending aorta dilatation (HCC)   4. Class 3 severe obesity due to excess calories with serious comorbidity in adult, unspecified BMI (HCC)   5. Mixed dyslipidemia    PLAN:    In order of problems listed above:  1. Primary prevention stressed with the patient.  Importance of compliance with diet medication stressed any vocalized understanding. 2. Diastolic congestive heart failure: I discussed this with the patient at extensive length.  He has gained weight in the past several weeks and I cautioned him against it.  He is trying to try to do his best to do better.  He is salt intake issues and weight reduction was stressed 3. Ascending aortic dilatation: Stable. 4. Essential hypertension: Blood pressure issues were discussed.  He has not taken torsemide for quite some time.  I have made following changes in his medications.  I stopped amiloride.  I started chlorthalidone 50 mg half tablet daily.  I doubled metoprolol to 50 mg daily.  Diet was emphasized weight reduction was stressed at extensive length and he promises to comply. 5. Mixed dyslipidemia: Lipids emphasized.  Diet emphasized.  He will be back in 1 month at that time we will do a liver lipid check and a Chem-7. 6. Morbid obesity: As mentioned above.  He promises to do better. 7. Patient will be seen in follow-up appointment in 4 weeks or earlier if the patient has any concerns    Medication Adjustments/Labs and Tests Ordered: Current medicines are reviewed at length with the patient today.  Concerns regarding medicines are outlined above.  No orders of the defined types were placed in this encounter.  No orders of the defined  types were placed in this encounter.    No chief complaint on file.    History of Present Illness:    Karl Jones is a 44 y.o. male.  Patient has past medical history of essential hypertension, dyslipidemia, diabetes mellitus and morbid obesity.  He has sleep apnea.  He denies any problems at this time and takes care of activities of daily living.  History much effort to take better care of himself.  He tells me that he was a little lax during the Thanksgiving holidays.  At the time of my evaluation, the patient is alert awake oriented and in no distress.  He is not taking torsemide at this time.  Past Medical History:  Diagnosis Date  . Acute on chronic respiratory failure with hypoxia and hypercapnia (HCC) 04/23/2019  . Angina pectoris (HCC) 09/27/2019  . Ascending aorta dilatation (HCC) 09/27/2019  . CHF (congestive heart failure) (HCC)   . Chronic diastolic heart failure (HCC) 04/02/2019  . Class 3 severe obesity in adult [facility]) 03/12/2019  . Diabetes mellitus (HCC) 03/13/2019   Last Assessment & Plan:  Formatting of this note is different from the original. Lab Results  Component Value Date   HBA1C 6.6 (H) 03/13/2019  - Continue SSI - Hypoglycemia protocol - TID AC fingersticks - Consistent carb diet  - Nutrition services consulted for diet instructions - Patient will require diabetic medication upon discharge  . Diabetes mellitus without complication (HCC)   .  DOE (dyspnea on exertion) 08/20/2019  . Elevated troponin I level 03/13/2019  . Essential hypertension 08/20/2019  . Hypertension   . Mixed dyslipidemia 08/20/2019  . Morbid obesity (HCC) 08/20/2019  . Physical deconditioning 03/13/2019   Last Assessment & Plan:  Formatting of this note might be different from the original. Patient had a recent fall at home - PT/OT consulted.  May likely require facility placement.  . Sleep apnea 08/20/2019  . Snoring 04/23/2019  . Witnessed apneic spells 04/23/2019    Past Surgical History:  Procedure  Laterality Date  . NO PAST SURGERIES      Current Medications: Current Meds  Medication Sig  . aMILoride (MIDAMOR) 5 MG tablet Take 1 tablet (5 mg total) by mouth daily.  Marland Kitchen amLODipine (NORVASC) 10 MG tablet Take 1 tablet (10 mg total) by mouth daily.  Marland Kitchen aspirin EC 81 MG tablet Take 1 tablet (81 mg total) by mouth daily. Swallow whole.  Marland Kitchen atorvastatin (LIPITOR) 40 MG tablet Take 40 mg by mouth daily.  Marland Kitchen losartan (COZAAR) 100 MG tablet Take 1 tablet (100 mg total) by mouth daily.  . metFORMIN (GLUCOPHAGE-XR) 500 MG 24 hr tablet Take 500 mg by mouth daily with breakfast.   . metoprolol tartrate (LOPRESSOR) 25 MG tablet Take 25 mg by mouth daily.  . nitroGLYCERIN (NITROSTAT) 0.4 MG SL tablet Place 1 tablet (0.4 mg total) under the tongue every 5 (five) minutes as needed.  . potassium chloride (KLOR-CON) 10 MEQ tablet TAKE 1 TABLET BY MOUTH ONCE DAILY  . torsemide (DEMADEX) 20 MG tablet Take 1 tablet (20 mg total) by mouth daily.     Allergies:   Patient has no known allergies.   Social History   Socioeconomic History  . Marital status: Single    Spouse name: Not on file  . Number of children: Not on file  . Years of education: Not on file  . Highest education level: Not on file  Occupational History  . Not on file  Tobacco Use  . Smoking status: Former Games developer  . Smokeless tobacco: Never Used  . Tobacco comment: last 3-4 years smoked about 3 cigrettes.  Substance and Sexual Activity  . Alcohol use: Yes    Comment: very rare.  . Drug use: Yes    Types: Marijuana    Comment: occasional/rare.  . Sexual activity: Not on file  Other Topics Concern  . Not on file  Social History Narrative  . Not on file   Social Determinants of Health   Financial Resource Strain: Not on file  Food Insecurity: Not on file  Transportation Needs: Not on file  Physical Activity: Not on file  Stress: Not on file  Social Connections: Not on file     Family History: The patient's family  history includes Dementia in his maternal grandmother; Hypertension in his mother.  ROS:   Please see the history of present illness.    All other systems reviewed and are negative.  EKGs/Labs/Other Studies Reviewed:    The following studies were reviewed today: I discussed the findings of the testing at this time.   Recent Labs: 08/20/2019: Magnesium 1.8 11/14/2019: Hemoglobin 17.1; Platelets 215; TSH 0.726 11/26/2019: ALT 26; BUN 15; Creatinine, Ser 0.86; Potassium 3.8; Pro B Natriuretic peptide (BNP) 62.0; Sodium 140  Recent Lipid Panel    Component Value Date/Time   CHOL 217 (H) 11/14/2019 0857   TRIG 114 11/14/2019 0857   HDL 46 11/14/2019 0857   CHOLHDL 4.7 11/14/2019 0857  CHOLHDL 4.2 10/17/2019 0927   VLDL 43.6 (H) 07/23/2019 1544   LDLCALC 150 (H) 11/14/2019 0857   LDLCALC 142 (H) 10/17/2019 0927   LDLDIRECT 135.0 07/23/2019 1544    Physical Exam:    VS:  BP (!) 152/88   Pulse 80   Ht 5\' 7"  (1.702 m)   Wt (!) 344 lb 1.3 oz (156.1 kg)   SpO2 95%   BMI 53.89 kg/m     Wt Readings from Last 3 Encounters:  12/21/19 (!) 344 lb 1.3 oz (156.1 kg)  11/19/19 (!) 337 lb (152.9 kg)  11/14/19 (!) 333 lb 1.3 oz (151.1 kg)     GEN: Patient is in no acute distress HEENT: Normal NECK: No JVD; No carotid bruits LYMPHATICS: No lymphadenopathy CARDIAC: Hear sounds regular, 2/6 systolic murmur at the apex. RESPIRATORY:  Clear to auscultation without rales, wheezing or rhonchi  ABDOMEN: Soft, non-tender, non-distended MUSCULOSKELETAL:  No edema; No deformity  SKIN: Warm and dry NEUROLOGIC:  Alert and oriented x 3 PSYCHIATRIC:  Normal affect   Signed, 13/03/21, MD  12/21/2019 9:15 AM    [facility]

## 2019-12-21 NOTE — Patient Instructions (Signed)
Medication Instructions:  Your physician has recommended you make the following change in your medication:   Stop Amiloride. Stop Torsemide. Increase Metoprolol to 50 mg daily. Start Chlorthalidone 50 mg take 1/2 tablet daily.   *If you need a refill on your cardiac medications before your next appointment, please call your pharmacy*   Lab Work: None ordered If you have labs (blood work) drawn today and your tests are completely normal, you will receive your results only by: Marland Kitchen MyChart Message (if you have MyChart) OR . A paper copy in the mail If you have any lab test that is abnormal or we need to change your treatment, we will call you to review the results.   Testing/Procedures: None ordered   Follow-Up: At [facility], you and your health needs are our priority.  As part of our continuing mission to provide you with exceptional heart care, we have created designated Provider Care Teams.  These Care Teams include your primary Cardiologist (physician) and Advanced Practice Providers (APPs -  Physician Assistants and Nurse Practitioners) who all work together to provide you with the care you need, when you need it.  We recommend signing up for the patient portal called "MyChart".  Sign up information is provided on this After Visit Summary.  MyChart is used to connect with patients for Virtual Visits (Telemedicine).  Patients are able to view lab/test results, encounter notes, upcoming appointments, etc.  Non-urgent messages can be sent to your provider as well.   To learn more about what you can do with MyChart, go to [email or web address].    Your next appointment:   1 month(s)  The format for your next appointment:   In Person  Provider:   Belva Crome, MD   Other Instructions Chlorthalidone Oral Tablets What is this medicine? CHLORTHALIDONE (klor THAL i done) is a diuretic. It helps you make more urine and to lose salt and excess water from your body. It treats  swelling from heart, kidney, or liver disease. It also treats high blood pressure. This medicine may be used for other purposes; ask your health care provider or pharmacist if you have questions. COMMON BRAND NAME(S): Thalitone What should I tell my health care provider before I take this medicine? They need to know if you have any of these conditions:  diabetes, high blood sugar  gout  kidney disease  liver disease  lung or breathing disease (asthma)  lupus  an unusual or allergic reaction to chlorthalidone, sulfa drugs, other drugs, foods, dyes or preservatives  pregnant or trying to get pregnant  breast-feeding How should I use this medicine? Take this drug by mouth. Take it as directed on the prescription label at the same time every day. Take it with food. Keep taking it unless your health care provider tells you to stop. Talk to your health care provider about the use of this drug in children. Special care may be needed. Overdosage: If you think you have taken too much of this medicine contact a poison control center or emergency room at once. NOTE: This medicine is only for you. Do not share this medicine with others. What if I miss a dose? If you miss a dose, take it as soon as you can. If it is almost time for your next dose, take only that dose. Do not take double or extra doses. What may interact with this medicine?  barbiturate medicines for sleep or seizure control  digoxin  lithium  medicines for diabetes  norepinephrine  other medicines for high blood pressure  some pain medicines  steroid hormones like prednisone, cortisone, hydrocortisone, corticotropin  tubocurarine This list may not describe all possible interactions. Give your health care provider a list of all the medicines, herbs, non-prescription drugs, or dietary supplements you use. Also tell them if you smoke, drink alcohol, or use illegal drugs. Some items may interact with your  medicine. What should I watch for while using this medicine? Visit your health care provider for regular checks on your progress. Check your blood pressure as directed. Ask your health care provider what your blood pressure should be. Also, find out when you should contact him or her. Check with your health care provider if you have severe diarrhea, nausea, and vomiting, or if you sweat a lot. The loss of too much body fluid may make it dangerous for you to take this drug. You may need to be on a special diet while you are taking this drug. Ask your health care provider. Also, find out how many glasses of fluids you need to drink each day. Do not treat yourself for coughs, colds, or pain while you are using this drug without asking your health care provider for advice. Some drugs may increase your blood pressure. You may get drowsy or dizzy. Do not drive, use machinery, or do anything that needs mental alertness until you know how this drug affects you. Do not stand up or sit up quickly, especially if you are an older patient. This reduces the risk of dizzy or fainting spells. Alcohol may interfere with the effect of this drug. Avoid alcoholic drinks. What side effects may I notice from receiving this medicine? Side effects that you should report to your doctor or health care provider as soon as possible:  allergic reactions (skin rash, itching or hives; swelling of the face, lips, or tongue)  kidney injury (trouble passing urine or change in the amount of urine)  low blood pressure (dizziness; feeling faint or lightheaded, falls; unusually weak or tired)  low potassium levels (trouble breathing; chest pain; dizziness; fast, irregular heartbeat; feeling faint or lightheaded, falls; muscle cramps or pain)  redness, blistering, peeling, or loosening of the skin, including inside the mouth  unusual bruising or bleeding Side effects that usually do not require medical attention (report to your  doctor or health care provider if they continue or are bothersome):  constipation  diarrhea  headache  increased thirst  loss of appetite  low red blood cell counts (trouble breathing; feeling faint; lightheaded, falls; unusually weak or tired)  unusual sweating  vomiting This list may not describe all possible side effects. Call your doctor for medical advice about side effects. You may report side effects to FDA at 1-800-FDA-1088. Where should I keep my medicine? Keep out of the reach of children and pets. Store at room temperature between 20 and 25 degrees C (68 and 77 degrees F). Protect from light. Throw away any unused drug after the expiration date. NOTE: This sheet is a summary. It may not cover all possible information. If you have questions about this medicine, talk to your doctor, pharmacist, or health care provider.  2020 Elsevier/Gold Standard (2018-10-17 14:45:31)

## 2020-02-08 ENCOUNTER — Encounter: Payer: Self-pay | Admitting: Cardiology

## 2020-02-08 ENCOUNTER — Other Ambulatory Visit: Payer: Self-pay

## 2020-02-08 ENCOUNTER — Ambulatory Visit (INDEPENDENT_AMBULATORY_CARE_PROVIDER_SITE_OTHER): Payer: 59 | Admitting: Cardiology

## 2020-02-08 VITALS — BP 156/94 | HR 98 | Ht 66.0 in | Wt 342.0 lb

## 2020-02-08 DIAGNOSIS — I10 Essential (primary) hypertension: Secondary | ICD-10-CM | POA: Diagnosis not present

## 2020-02-08 DIAGNOSIS — E119 Type 2 diabetes mellitus without complications: Secondary | ICD-10-CM

## 2020-02-08 DIAGNOSIS — I712 Thoracic aortic aneurysm, without rupture, unspecified: Secondary | ICD-10-CM

## 2020-02-08 DIAGNOSIS — I209 Angina pectoris, unspecified: Secondary | ICD-10-CM

## 2020-02-08 DIAGNOSIS — I77810 Thoracic aortic ectasia: Secondary | ICD-10-CM

## 2020-02-08 MED ORDER — LOSARTAN POTASSIUM 100 MG PO TABS: 100.0000 mg | ORAL_TABLET | Freq: Every day | ORAL | 3 refills | Status: DC

## 2020-02-08 MED ORDER — METOPROLOL TARTRATE 50 MG PO TABS
50.0000 mg | ORAL_TABLET | Freq: Every day | ORAL | 3 refills | Status: DC
Start: 2020-02-08 — End: 2020-03-07

## 2020-02-08 MED ORDER — ATORVASTATIN CALCIUM 40 MG PO TABS: 40.0000 mg | ORAL_TABLET | Freq: Every day | ORAL | 3 refills | Status: DC

## 2020-02-08 MED ORDER — CHLORTHALIDONE 50 MG PO TABS: 25.0000 mg | ORAL_TABLET | Freq: Every day | ORAL | 3 refills | Status: DC

## 2020-02-08 MED ORDER — NITROGLYCERIN 0.4 MG SL SUBL: 0.4000 mg | SUBLINGUAL_TABLET | SUBLINGUAL | 6 refills | Status: DC | PRN

## 2020-02-08 NOTE — Progress Notes (Signed)
Cardiology Office Note:    Date:  02/08/2020   ID:  Karl Jones, DOB Apr 25, 1975, MRN [id number]  PCP:  Esperanza Richters, PA-C  Cardiologist:  Garwin Brothers, MD   Referring MD: Esperanza Richters, PA-C    ASSESSMENT:    1. Ascending aorta dilatation (HCC)   2. Essential hypertension   3. Diabetes mellitus without complication (HCC)   4. Morbid obesity (HCC)    PLAN:    In order of problems listed above:  1. Primary prevention stressed with the patient. Importance of compliance with diet medication stressed any vocalized understanding. I told him that these noncompliant behavior can affect his health adversely and the risks was explained and he vocalized understanding. 2. Essential hypertension: Blood pressure is elevated but is not taking his medications regularly I stressed the importance of compliance, lifestyle modification and salt intake issues were urged and he vocalized understanding. We will refill his medications. We'll do a Chem-7 today. He'll be back in 1 month for follow-up appointment he'll keep a track of his blood pressures. 3. Mixed dyslipidemia: Diet was emphasized again. 4. Diabetes mellitus: He has run out of his Metformin I told him to get in touch with his primary care physician ASAP to assess this. 5. Morbid obesity: Weight reduction was stressed risks of obesity explained and he promises to comply. 6. Follow-up appointment in a month or earlier if he has any concerns. 7. Ascending aortic dilatation: We'll do a CT scan of the chest with contrast for follow-up. He is agreeable.   Medication Adjustments/Labs and Tests Ordered: Current medicines are reviewed at length with the patient today.  Concerns regarding medicines are outlined above.  No orders of the defined types were placed in this encounter.  No orders of the defined types were placed in this encounter.    No chief complaint on file.    History of Present Illness:    Karl Jones is a 45  y.o. male. Patient has past medical history of ascending aortic dilatation, chronic diastolic congestive heart failure, morbid obesity, essential hypertension and diabetes mellitus. He denies any problems at this time and takes care of activities of daily living. No chest pain orthopnea or PND. Patient mentions to me that he has not taken some medications as he ran out of them. Is not been compliant with diet and medications. He leads a sedentary lifestyle. His lifestyle is high risk. At the time of my evaluation, the patient is alert awake oriented and in no distress.  Past Medical History:  Diagnosis Date  . Acute on chronic respiratory failure with hypoxia and hypercapnia (HCC) 04/23/2019  . Angina pectoris (HCC) 09/27/2019  . Ascending aorta dilatation (HCC) 09/27/2019  . CHF (congestive heart failure) (HCC)   . Chronic diastolic heart failure (HCC) 04/02/2019  . Class 3 severe obesity in adult [facility]) 03/12/2019  . Diabetes mellitus (HCC) 03/13/2019   Last Assessment & Plan:  Formatting of this note is different from the original. Lab Results  Component Value Date   HBA1C 6.6 (H) 03/13/2019  - Continue SSI - Hypoglycemia protocol - TID AC fingersticks - Consistent carb diet  - Nutrition services consulted for diet instructions - Patient will require diabetic medication upon discharge  . Diabetes mellitus without complication (HCC)   . DOE (dyspnea on exertion) 08/20/2019  . Elevated troponin I level 03/13/2019  . Essential hypertension 08/20/2019  . Hypertension   . Mixed dyslipidemia 08/20/2019  . Morbid obesity (HCC) 08/20/2019  . Physical  deconditioning 03/13/2019   Last Assessment & Plan:  Formatting of this note might be different from the original. Patient had a recent fall at home - PT/OT consulted.  May likely require facility placement.  . Sleep apnea 08/20/2019  . Snoring 04/23/2019  . Witnessed apneic spells 04/23/2019    Past Surgical History:  Procedure Laterality Date  . NO PAST SURGERIES       Current Medications: Current Meds  Medication Sig  . amLODipine (NORVASC) 10 MG tablet Take 1 tablet (10 mg total) by mouth daily.  Marland Kitchen atorvastatin (LIPITOR) 40 MG tablet Take 40 mg by mouth daily.  . chlorthalidone (HYGROTON) 50 MG tablet Take 0.5 tablets (25 mg total) by mouth daily.  Marland Kitchen losartan (COZAAR) 100 MG tablet Take 1 tablet (100 mg total) by mouth daily.  . metFORMIN (GLUCOPHAGE-XR) 500 MG 24 hr tablet Take 500 mg by mouth daily with breakfast.   . metoprolol tartrate (LOPRESSOR) 50 MG tablet Take 1 tablet (50 mg total) by mouth daily.     Allergies:   Patient has no known allergies.   Social History   Socioeconomic History  . Marital status: Single    Spouse name: Not on file  . Number of children: Not on file  . Years of education: Not on file  . Highest education level: Not on file  Occupational History  . Not on file  Tobacco Use  . Smoking status: Former Games developer  . Smokeless tobacco: Never Used  . Tobacco comment: last 3-4 years smoked about 3 cigrettes.  Substance and Sexual Activity  . Alcohol use: Yes    Comment: very rare.  . Drug use: Yes    Types: Marijuana    Comment: occasional/rare.  . Sexual activity: Not on file  Other Topics Concern  . Not on file  Social History Narrative  . Not on file   Social Determinants of Health   Financial Resource Strain: Not on file  Food Insecurity: Not on file  Transportation Needs: Not on file  Physical Activity: Not on file  Stress: Not on file  Social Connections: Not on file     Family History: The patient's family history includes Dementia in his maternal grandmother; Hypertension in his mother.  ROS:   Please see the history of present illness.    All other systems reviewed and are negative.  EKGs/Labs/Other Studies Reviewed:    The following studies were reviewed today: I discussed my findings with the patient at length.   Recent Labs: 08/20/2019: Magnesium 1.8 11/14/2019: Hemoglobin 17.1;  Platelets 215; TSH 0.726 11/26/2019: ALT 26; BUN 15; Creatinine, Ser 0.86; Potassium 3.8; Pro B Natriuretic peptide (BNP) 62.0; Sodium 140  Recent Lipid Panel    Component Value Date/Time   CHOL 217 (H) 11/14/2019 0857   TRIG 114 11/14/2019 0857   HDL 46 11/14/2019 0857   CHOLHDL 4.7 11/14/2019 0857   CHOLHDL 4.2 10/17/2019 0927   VLDL 43.6 (H) 07/23/2019 1544   LDLCALC 150 (H) 11/14/2019 0857   LDLCALC 142 (H) 10/17/2019 0927   LDLDIRECT 135.0 07/23/2019 1544    Physical Exam:    VS:  BP (!) 156/94   Pulse 98   Ht 5\' 6"  (1.676 m)   Wt (!) 342 lb (155.1 kg)   SpO2 93%   BMI 55.20 kg/m     Wt Readings from Last 3 Encounters:  02/08/20 (!) 342 lb (155.1 kg)  12/21/19 (!) 344 lb 1.3 oz (156.1 kg)  11/19/19 (!) 337 lb (  152.9 kg)     GEN: Patient is in no acute distress HEENT: Normal NECK: No JVD; No carotid bruits LYMPHATICS: No lymphadenopathy CARDIAC: Hear sounds regular, 2/6 systolic murmur at the apex. RESPIRATORY:  Clear to auscultation without rales, wheezing or rhonchi  ABDOMEN: Soft, non-tender, non-distended MUSCULOSKELETAL:  No edema; No deformity  SKIN: Warm and dry NEUROLOGIC:  Alert and oriented x 3 PSYCHIATRIC:  Normal affect   Signed, Garwin Brothers, MD  02/08/2020 8:52 AM    [facility]

## 2020-02-08 NOTE — Patient Instructions (Addendum)
Medication Instructions:  No medication changes. *If you need a refill on your cardiac medications before your next appointment, please call your pharmacy*   Lab Work: Your physician recommends that you have a BMET today. If you have labs (blood work) drawn today and your tests are completely normal, you will receive your results only by: Marland Kitchen MyChart Message (if you have MyChart) OR . A paper copy in the mail If you have any lab test that is abnormal or we need to change your treatment, we will call you to review the results.   Testing/Procedures: Non-Cardiac CT scanning, (CAT scanning), is a noninvasive, special x-ray that produces cross-sectional images of the body using x-rays and a computer. CT scans help physicians diagnose and treat medical conditions. For some CT exams, a contrast material is used to enhance visibility in the area of the body being studied. CT scans provide greater clarity and reveal more details than regular x-ray exams.    Follow-Up: At [facility], you and your health needs are our priority.  As part of our continuing mission to provide you with exceptional heart care, we have created designated Provider Care Teams.  These Care Teams include your primary Cardiologist (physician) and Advanced Practice Providers (APPs -  Physician Assistants and Nurse Practitioners) who all work together to provide you with the care you need, when you need it.  We recommend signing up for the patient portal called "MyChart".  Sign up information is provided on this After Visit Summary.  MyChart is used to connect with patients for Virtual Visits (Telemedicine).  Patients are able to view lab/test results, encounter notes, upcoming appointments, etc.  Non-urgent messages can be sent to your provider as well.   To learn more about what you can do with MyChart, go to [email or web address].    Your next appointment:   1 month(s)  The format for your next appointment:   In  Person  Provider:   Belva Crome, MD   Other Instructions  CT Scan  A CT scan is a kind of X-ray. A CT scan makes pictures of the inside of your body. In this procedure, the pictures will be taken in a large machine that has an opening (CT scanner). What happens before the procedure? Staying hydrated Follow instructions from your doctor about hydration, which may include:  Up to 2 hours before the procedure - you may continue to drink clear liquids. These include water, clear fruit juice, black coffee, and plain tea. Eating and drinking restrictions Follow instructions from your doctor about eating and drinking, which may include:  24 hours before the procedure - stop drinking caffeinated drinks. These include energy drinks, tea, soda, coffee, and hot chocolate.  8 hours before the procedure - stop eating heavy meals or foods. These include meat, fried foods, or fatty foods.  6 hours before the procedure - stop eating light meals or foods. These include toast or cereal.  6 hours before the procedure - stop drinking milk or drinks that have milk in them.  2 hours before the procedure - stop drinking clear liquids. General instructions  Take off any jewelry.  Ask your doctor about changing or stopping your normal medicines. This is important if you take diabetes medicines or blood thinners. What happens during the procedure?  You will lie on a table with your arms above your head.  An IV tube may be put into one of your veins.  Dye may be put into the IV tube. You  may feel warm or have a metal taste in your mouth.  The table you will be lying on will move into the CT scanner.  You will be able to see, hear, and talk to the person who is running the machine while you are in it. Follow that person's directions.  The machine will move around you to take pictures. Do not move.  When the machine is done taking pictures, it will be turned off.  The table will be moved out  of the machine.  Your IV tube will be taken out. The procedure may vary among doctors and hospitals. What happens after the procedure?  It is up to you to get your results. Ask when your results will be ready. Summary  A CT scan is a kind of X-ray.  A CT scan makes pictures of the inside of your body.  Follow instructions from your doctor about eating and drinking before the procedure.  You will be able to see, hear, and talk to the person who is running the machine while you are in it. Follow that person's directions. This information is not intended to replace advice given to you by your health care provider. Make sure you discuss any questions you have with your health care provider. Document Revised: 01/11/2016 Document Reviewed: 01/15/2016 Elsevier Patient Education  2021 [employer].

## 2020-02-09 LAB — BASIC METABOLIC PANEL
BUN/Creatinine Ratio: 16 (ref 9–20)
BUN: 14 mg/dL (ref 6–24)
CO2: 29 mmol/L (ref 20–29)
Calcium: 9.2 mg/dL (ref 8.7–10.2)
Chloride: 96 mmol/L (ref 96–106)
Creatinine, Ser: 0.85 mg/dL (ref 0.76–1.27)
GFR calc Af Amer: 123 mL/min/{1.73_m2} (ref >59)
GFR calc non Af Amer: 107 mL/min/{1.73_m2} (ref >59)
Glucose: 113 mg/dL — ABNORMAL HIGH (ref 65–99)
Potassium: 3.5 mmol/L (ref 3.5–5.2)
Sodium: 138 mmol/L (ref 134–144)

## 2020-03-07 ENCOUNTER — Ambulatory Visit (INDEPENDENT_AMBULATORY_CARE_PROVIDER_SITE_OTHER): Payer: 59 | Admitting: Cardiology

## 2020-03-07 ENCOUNTER — Encounter: Payer: Self-pay | Admitting: Cardiology

## 2020-03-07 ENCOUNTER — Other Ambulatory Visit: Payer: Self-pay

## 2020-03-07 VITALS — BP 148/82 | HR 82 | Ht 66.0 in | Wt 343.1 lb

## 2020-03-07 DIAGNOSIS — R0600 Dyspnea, unspecified: Secondary | ICD-10-CM

## 2020-03-07 DIAGNOSIS — E119 Type 2 diabetes mellitus without complications: Secondary | ICD-10-CM

## 2020-03-07 DIAGNOSIS — I5020 Unspecified systolic (congestive) heart failure: Secondary | ICD-10-CM

## 2020-03-07 DIAGNOSIS — I10 Essential (primary) hypertension: Secondary | ICD-10-CM

## 2020-03-07 DIAGNOSIS — I77810 Thoracic aortic ectasia: Secondary | ICD-10-CM

## 2020-03-07 DIAGNOSIS — E782 Mixed hyperlipidemia: Secondary | ICD-10-CM

## 2020-03-07 DIAGNOSIS — R0609 Other forms of dyspnea: Secondary | ICD-10-CM

## 2020-03-07 MED ORDER — LOSARTAN POTASSIUM 100 MG PO TABS: 100.0000 mg | ORAL_TABLET | Freq: Every day | ORAL | 3 refills | Status: AC

## 2020-03-07 MED ORDER — ATORVASTATIN CALCIUM 40 MG PO TABS: 40.0000 mg | ORAL_TABLET | Freq: Every day | ORAL | 3 refills | Status: AC

## 2020-03-07 MED ORDER — METOPROLOL TARTRATE 50 MG PO TABS: 50.0000 mg | ORAL_TABLET | Freq: Every day | ORAL | 3 refills | Status: AC

## 2020-03-07 MED ORDER — CHLORTHALIDONE 50 MG PO TABS: 25.0000 mg | ORAL_TABLET | Freq: Every day | ORAL | 3 refills | Status: AC

## 2020-03-07 MED ORDER — AMLODIPINE BESYLATE 10 MG PO TABS: 10.0000 mg | ORAL_TABLET | Freq: Every day | ORAL | 3 refills | Status: AC

## 2020-03-07 MED ORDER — NITROGLYCERIN 0.4 MG SL SUBL: 0.4000 mg | SUBLINGUAL_TABLET | SUBLINGUAL | 6 refills | Status: AC | PRN

## 2020-03-07 NOTE — Progress Notes (Signed)
Cardiology Office Note:    Date:  03/07/2020   ID:  Karl Jones, DOB 1975-08-28, MRN [id number]  PCP:  Esperanza Richters, PA-C  Cardiologist:  Garwin Brothers, MD   Referring MD: Esperanza Richters, PA-C    ASSESSMENT:    1. Systolic congestive heart failure, unspecified HF chronicity (HCC)   2. Essential hypertension   3. Ascending aorta dilatation (HCC)   4. DOE (dyspnea on exertion)   5. Morbid obesity (HCC)   6. Mixed dyslipidemia   7. Diabetes mellitus without complication (HCC)    PLAN:    In order of problems listed above:  1. Primary prevention stressed with patient.  Importance of compliance with diet medication stressed and he vocalized understanding. 2. Essential hypertension: Blood pressure stable and diet was emphasized.  He tells me that his blood pressure is better at home.  Salt intake and exercise were emphasized and he promises to do better. 3. Mixed dyslipidemia: Lipids were reviewed.  We will refill his medications today.  I want him to come back to do blood work in the next few days and he promises to do so next week. 4. Diabetes mellitus: Managed by primary care.  I reviewed hemoglobin A1c.  Diet was emphasized. 5. Morbid obesity: Diet specified.  Risks of obesity explained any vocalized understanding.  He is going to start dieting and exercising better.  Lifestyle modification was urged. 6. Ascending aortic dilatation: We will monitor this closely on an annual basis and I discussed this with him and he understands 7. Patient will be seen in follow-up appointment in 2 months or earlier if the patient has any concerns    Medication Adjustments/Labs and Tests Ordered: Current medicines are reviewed at length with the patient today.  Concerns regarding medicines are outlined above.  No orders of the defined types were placed in this encounter.  No orders of the defined types were placed in this encounter.    No chief complaint on file.    History of  Present Illness:    Karl Jones is a 45 y.o. male.  Patient has past medical history of congestive heart failure, essential hypertension, lipidemia, diabetes mellitus, morbid obesity and has certainly cardiac dilatation.  He denies any problems at this time and takes care of activities of daily living.  He leads a sedentary lifestyle.  Because of the pandemic he tells me that he has not gotten out of home or exercise.  At the time of my evaluation, the patient is alert awake oriented and in no distress.  Past Medical History:  Diagnosis Date   Acute on chronic respiratory failure with hypoxia and hypercapnia (HCC) 04/23/2019   Angina pectoris (HCC) 09/27/2019   Ascending aorta dilatation (HCC) 09/27/2019   CHF (congestive heart failure) (HCC)    Chronic diastolic heart failure (HCC) 04/02/2019   Class 3 severe obesity in adult [facility]) 03/12/2019   Diabetes mellitus (HCC) 03/13/2019   Last Assessment & Plan:  Formatting of this note is different from the original. Lab Results  Component Value Date   HBA1C 6.6 (H) 03/13/2019  - Continue SSI - Hypoglycemia protocol - TID AC fingersticks - Consistent carb diet  - Nutrition services consulted for diet instructions - Patient will require diabetic medication upon discharge   Diabetes mellitus without complication (HCC)    DOE (dyspnea on exertion) 08/20/2019   Elevated troponin I level 03/13/2019   Essential hypertension 08/20/2019   Hypertension    Mixed dyslipidemia 08/20/2019  Morbid obesity (HCC) 08/20/2019   Physical deconditioning 03/13/2019   Last Assessment & Plan:  Formatting of this note might be different from the original. Patient had a recent fall at home - PT/OT consulted.  May likely require facility placement.   Sleep apnea 08/20/2019   Snoring 04/23/2019   Witnessed apneic spells 04/23/2019    Past Surgical History:  Procedure Laterality Date   NO PAST SURGERIES      Current Medications: Current Meds  Medication Sig    amLODipine (NORVASC) 10 MG tablet Take 1 tablet (10 mg total) by mouth daily.   atorvastatin (LIPITOR) 40 MG tablet Take 1 tablet (40 mg total) by mouth daily.   chlorthalidone (HYGROTON) 50 MG tablet Take 0.5 tablets (25 mg total) by mouth daily.   losartan (COZAAR) 100 MG tablet Take 1 tablet (100 mg total) by mouth daily.   metFORMIN (GLUCOPHAGE-XR) 500 MG 24 hr tablet Take 500 mg by mouth daily with breakfast.    metoprolol tartrate (LOPRESSOR) 50 MG tablet Take 1 tablet (50 mg total) by mouth daily.   nitroGLYCERIN (NITROSTAT) 0.4 MG SL tablet Place 0.4 mg under the tongue every 5 (five) minutes as needed for chest pain.     Allergies:   Patient has no known allergies.   Social History   Socioeconomic History   Marital status: Single    Spouse name: Not on file   Number of children: Not on file   Years of education: Not on file   Highest education level: Not on file  Occupational History   Not on file  Tobacco Use   Smoking status: Former Smoker   Smokeless tobacco: Never Used   Tobacco comment: last 3-4 years smoked about 3 cigrettes.  Substance and Sexual Activity   Alcohol use: Yes    Comment: very rare.   Drug use: Yes    Types: Marijuana    Comment: occasional/rare.   Sexual activity: Not on file  Other Topics Concern   Not on file  Social History Narrative   Not on file   Social Determinants of Health   Financial Resource Strain: Not on file  Food Insecurity: Not on file  Transportation Needs: Not on file  Physical Activity: Not on file  Stress: Not on file  Social Connections: Not on file     Family History: The patient's family history includes Dementia in his maternal grandmother; Hypertension in his mother.  ROS:   Please see the history of present illness.    All other systems reviewed and are negative.  EKGs/Labs/Other Studies Reviewed:    The following studies were reviewed today: I discussed my findings with the patient at  length last echo revealed preserved systolic function done late last year.   Recent Labs: 08/20/2019: Magnesium 1.8 11/14/2019: Hemoglobin 17.1; Platelets 215; TSH 0.726 11/26/2019: ALT 26; Pro B Natriuretic peptide (BNP) 62.0 02/08/2020: BUN 14; Creatinine, Ser 0.85; Potassium 3.5; Sodium 138  Recent Lipid Panel    Component Value Date/Time   CHOL 217 (H) 11/14/2019 0857   TRIG 114 11/14/2019 0857   HDL 46 11/14/2019 0857   CHOLHDL 4.7 11/14/2019 0857   CHOLHDL 4.2 10/17/2019 0927   VLDL 43.6 (H) 07/23/2019 1544   LDLCALC 150 (H) 11/14/2019 0857   LDLCALC 142 (H) 10/17/2019 0927   LDLDIRECT 135.0 07/23/2019 1544    Physical Exam:    VS:  BP (!) 148/82    Pulse 82    Ht 5\' 6"  (1.676 m)  Wt (!) 343 lb 1.3 oz (155.6 kg)    SpO2 94%    BMI 55.37 kg/m     Wt Readings from Last 3 Encounters:  03/07/20 (!) 343 lb 1.3 oz (155.6 kg)  02/08/20 (!) 342 lb (155.1 kg)  12/21/19 (!) 344 lb 1.3 oz (156.1 kg)     GEN: Patient is in no acute distress HEENT: Normal NECK: No JVD; No carotid bruits LYMPHATICS: No lymphadenopathy CARDIAC: Hear sounds regular, 2/6 systolic murmur at the apex. RESPIRATORY:  Clear to auscultation without rales, wheezing or rhonchi  ABDOMEN: Soft, non-tender, non-distended MUSCULOSKELETAL:  No edema; No deformity  SKIN: Warm and dry NEUROLOGIC:  Alert and oriented x 3 PSYCHIATRIC:  Normal affect   Signed, Garwin Brothers, MD  03/07/2020 4:11 PM    [facility]

## 2020-03-07 NOTE — Patient Instructions (Signed)
Medication Instructions:  No medication changes. *If you need a refill on your cardiac medications before your next appointment, please call your pharmacy*   Lab Work: Your physician recommends that you return for lab work in: next few days. You need to have labs done when you are fasting.  You can come Monday through Friday 8:30 am to 12:00 pm and 1:15 to 4:30. You do not need to make an appointment as the order has already been placed. The labs you are going to have done are BMET, TSH, LFT and Lipids.   If you have labs (blood work) drawn today and your tests are completely normal, you will receive your results only by: Marland Kitchen MyChart Message (if you have MyChart) OR . A paper copy in the mail If you have any lab test that is abnormal or we need to change your treatment, we will call you to review the results.   Testing/Procedures: None ordered   Follow-Up: At [facility], you and your health needs are our priority.  As part of our continuing mission to provide you with exceptional heart care, we have created designated Provider Care Teams.  These Care Teams include your primary Cardiologist (physician) and Advanced Practice Providers (APPs -  Physician Assistants and Nurse Practitioners) who all work together to provide you with the care you need, when you need it.  We recommend signing up for the patient portal called "MyChart".  Sign up information is provided on this After Visit Summary.  MyChart is used to connect with patients for Virtual Visits (Telemedicine).  Patients are able to view lab/test results, encounter notes, upcoming appointments, etc.  Non-urgent messages can be sent to your provider as well.   To learn more about what you can do with MyChart, go to [email or web address].    Your next appointment:   2 month(s)  The format for your next appointment:   In Person  Provider:   Belva Crome, MD   Other Instructions NA

## 2020-05-13 ENCOUNTER — Ambulatory Visit: Payer: Self-pay | Admitting: Cardiology

## 2021-03-11 DEATH — deceased

## 2021-03-18 ENCOUNTER — Encounter: Payer: Self-pay | Admitting: *Deleted

## 2022-08-17 IMAGING — CT CT HEART MORP W/ CTA COR W/ SCORE W/ CA W/CM &/OR W/O CM
4 of 7 series · 8 of 20 positions shown, 9 images · IV contrast (APPLIED)
Comparison: None.
COMPARISON: None.

Addendum:
EXAM:
OVER-READ INTERPRETATION  CT CHEST

The following report is an over-read performed by radiologist Dr.
Gunanidhi Lianhna [REDACTED] on 10/23/2019. This
over-read does not include interpretation of cardiac or coronary
anatomy or pathology. The coronary calcium score/coronary CTA
interpretation by the cardiologist is attached.
HISTORY: 43 yo male with chest pain/anginal equiv, ECGs and troponins normal
Cardiac/Coronary CTA
TECHNIQUE: The patient was scanned on a Siemens Force scanner.
PROTOCOL: A 120 kV prospective scan was triggered in the descending thoracic
aorta at 111 HU's. Axial non-contrast 3 mm slices were carried out
through the heart. The data set was analyzed on a dedicated work
station and scored using the Agatson method. Gantry rotation speed
was 250 msecs and collimation was .6 mm. Beta blockade and 0.8 mg of
sl NTG was given. The 3D data set was reconstructed in 5% intervals
of the 67-82 % of the R-R cycle. Diastolic phases were analyzed on a
dedicated work station using MPR, MIP and VRT modes. The patient
received 100mL OMNIPAQUE IOHEXOL 350 MG/ML SOLN of contrast.

[Series 6: best diast 68 % · axial · 0.42mm/px · z∈[-294,-248]mm · 2 of 346 slices shown, 3 images]
[im 116/346  vessel]
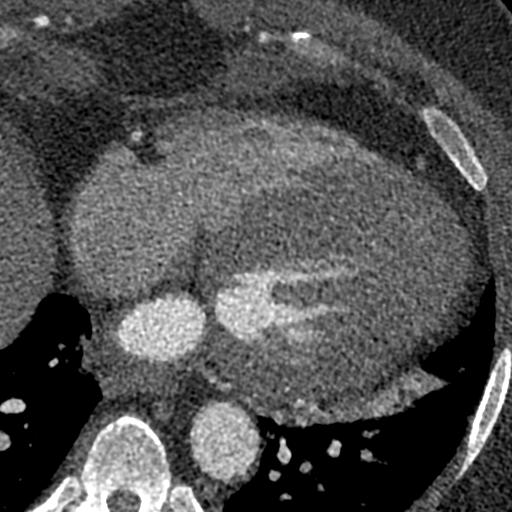
[im 116/346  lung]
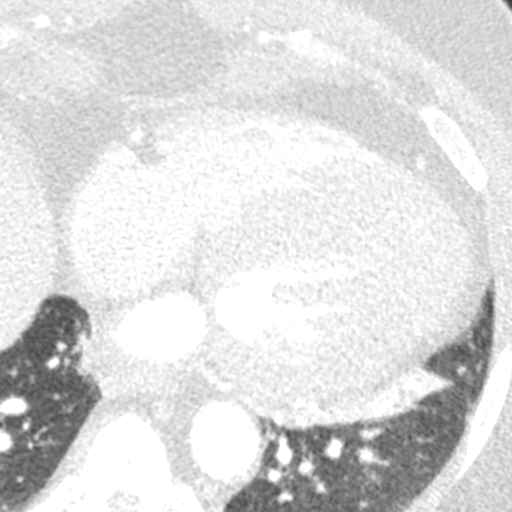
[im 231/346  vessel]
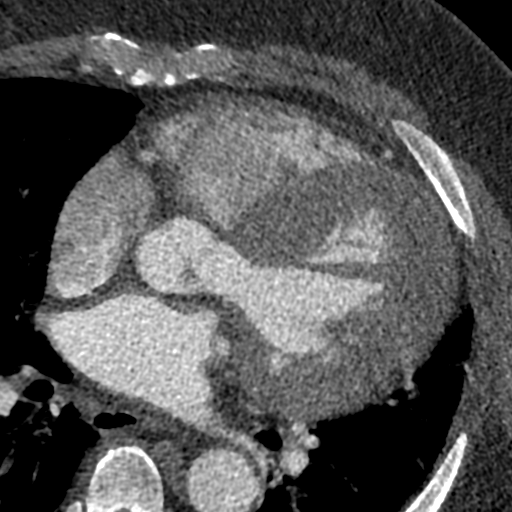

[Series 7: best syst 38 % · axial · 0.42mm/px · z∈[-294,-248]mm · 2 of 346 slices shown]
[im 116/346  vessel]
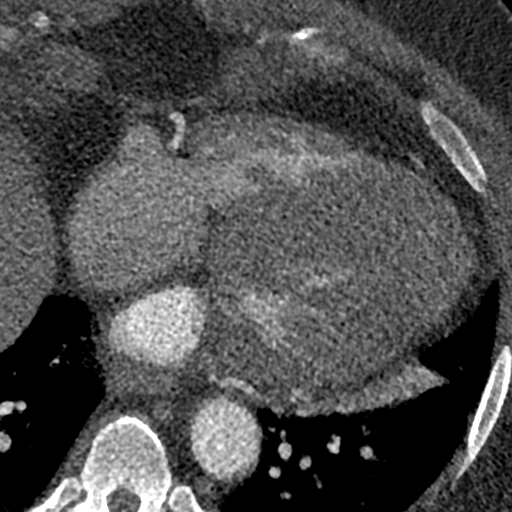
[im 231/346  vessel]
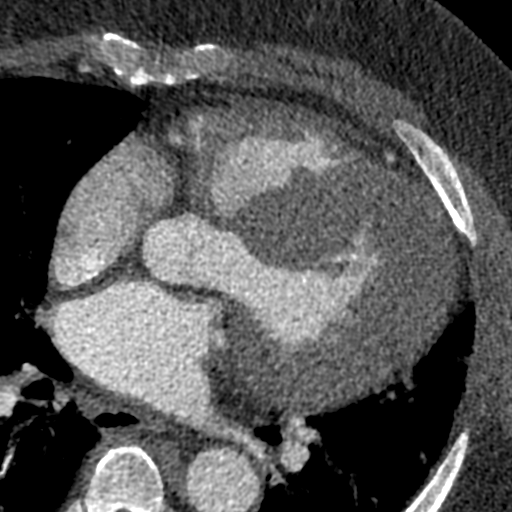

[Series 8: ts diast sharp 68 % · axial · 0.42mm/px · z∈[-294,-248]mm · 2 of 346 slices shown]
[im 116/346  lung]
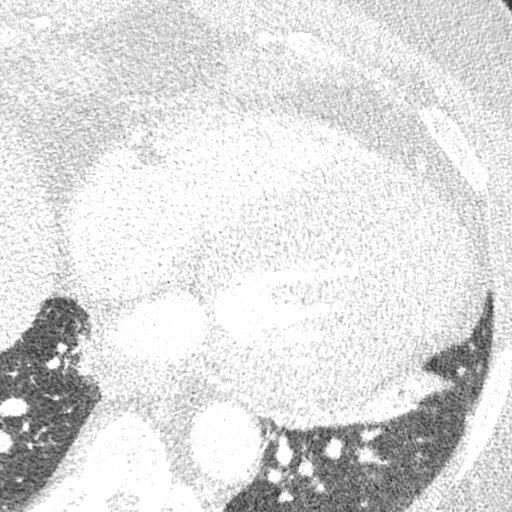
[im 231/346  lung]
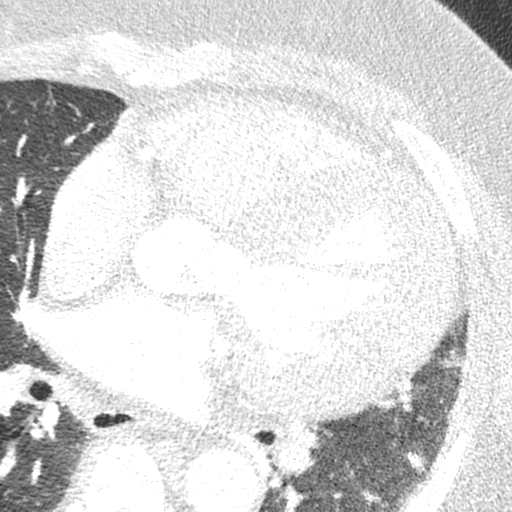

[Series 9: ts syst sharp 38 % · axial · 0.42mm/px · z∈[-294,-248]mm · 2 of 346 slices shown]
[im 116/346  lung]
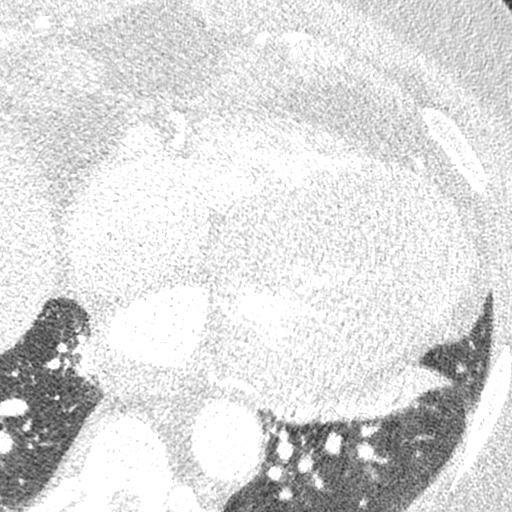
[im 231/346  lung]
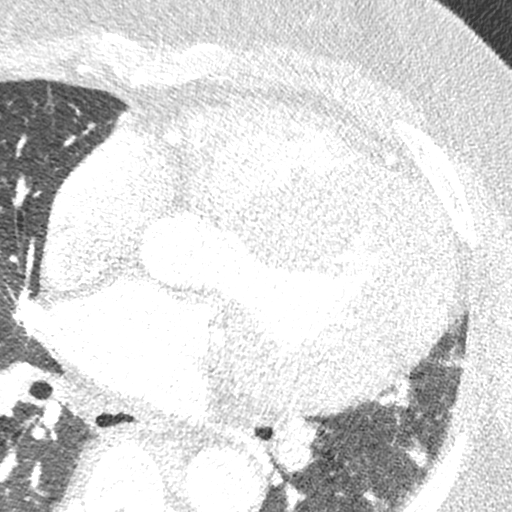

[8 of 20 positions shown; findings below may reference images not displayed]

FINDINGS: Within the visualized portions of the thorax there are no suspicious
appearing pulmonary nodules or masses, there is no acute
consolidative airspace disease, no pleural effusions, no
pneumothorax and no lymphadenopathy. Visualized portions of the
upper abdomen are unremarkable. There are no aggressive appearing
lytic or blastic lesions noted in the visualized portions of the
skeleton.
IMPRESSION: No significant incidental noncardiac findings are noted.
FINDINGS: Quality: Poor, HR 66, attenuation artifact, motion artifact.

Coronary calcium score: The patient's coronary artery calcium score
is 0, which places the patient in the 0 percentile.

Coronary arteries: Normal coronary origins.  Right dominance.

Right Coronary Artery: Dominant. Large caliber vessel. No stenosis.

Left Main Coronary Artery: Normal. Bifurcates into the LAD and LCx
arteries.

Left Anterior Descending Coronary Artery: Large caliber vessel.
Coarses anteriorly and reaches the apex. No stenosis. Large 1st
septal perforator and D1 branch without disease.

Left Circumflex Artery: AV groove LCx vessel, large caliber, without
disease.

Aorta: Mildly dilated at 40 mm at the mid ascending aorta (level of
the PA bifurcation) measured double oblique. No calcifications. No
dissection.

Aortic Valve: Trileaflet.  No calcifications.

Other findings:

Normal pulmonary vein drainage into the left atrium.

Normal left atrial appendage without a thrombus.

Dilated pulmonary artery at 33 mm, suggestive of pulmonary
hypertension.
IMPRESSION: 1. Notwithstanding study artifact, there is no evidence of CAD,
CADRADS = 0.

2. Coronary calcium score of 0. This was 0 percentile for age and
sex matched control.

3. Normal coronary origin with right dominance.

4. Mildly dilated mid-ascending aorta at 40 mm.

5. Dilated pulmonary artery at 33 mm, suggestive of pulmonary
hypertension.

*** End of Addendum ***
EXAM:
OVER-READ INTERPRETATION  CT CHEST

The following report is an over-read performed by radiologist Dr.
Gunanidhi Lianhna [REDACTED] on 10/23/2019. This
over-read does not include interpretation of cardiac or coronary
anatomy or pathology. The coronary calcium score/coronary CTA
interpretation by the cardiologist is attached.
FINDINGS: Within the visualized portions of the thorax there are no suspicious
appearing pulmonary nodules or masses, there is no acute
consolidative airspace disease, no pleural effusions, no
pneumothorax and no lymphadenopathy. Visualized portions of the
upper abdomen are unremarkable. There are no aggressive appearing
lytic or blastic lesions noted in the visualized portions of the
skeleton.
IMPRESSION: No significant incidental noncardiac findings are noted.
